# Patient Record
Sex: Female | Born: 1955 | Race: Black or African American | Hispanic: No | Marital: Single | Smoking: Current every day smoker
Health system: Southern US, Community
[De-identification: ages and names within clinical notes are randomized; demographics above are authoritative.]

## PROBLEM LIST (undated history)

## (undated) DIAGNOSIS — I10 Essential (primary) hypertension: Secondary | ICD-10-CM

## (undated) DIAGNOSIS — D649 Anemia, unspecified: Secondary | ICD-10-CM

## (undated) DIAGNOSIS — G629 Polyneuropathy, unspecified: Secondary | ICD-10-CM

## (undated) DIAGNOSIS — F329 Major depressive disorder, single episode, unspecified: Secondary | ICD-10-CM

## (undated) DIAGNOSIS — C50919 Malignant neoplasm of unspecified site of unspecified female breast: Secondary | ICD-10-CM

## (undated) DIAGNOSIS — Z9221 Personal history of antineoplastic chemotherapy: Secondary | ICD-10-CM

## (undated) DIAGNOSIS — E7800 Pure hypercholesterolemia, unspecified: Secondary | ICD-10-CM

## (undated) DIAGNOSIS — F32A Depression, unspecified: Secondary | ICD-10-CM

## (undated) HISTORY — DX: Depression, unspecified: F32.A

## (undated) HISTORY — DX: Malignant neoplasm of unspecified site of unspecified female breast: C50.919

## (undated) HISTORY — PX: PORTACATH PLACEMENT: SHX2246

## (undated) HISTORY — DX: Major depressive disorder, single episode, unspecified: F32.9

## (undated) HISTORY — DX: Essential (primary) hypertension: I10

## (undated) HISTORY — DX: Pure hypercholesterolemia, unspecified: E78.00

---

## 1979-05-22 HISTORY — PX: ABDOMINAL HYSTERECTOMY: SHX81

## 2013-05-21 HISTORY — PX: BREAST BIOPSY: SHX20

## 2013-09-18 DIAGNOSIS — C50919 Malignant neoplasm of unspecified site of unspecified female breast: Secondary | ICD-10-CM

## 2013-09-18 HISTORY — DX: Malignant neoplasm of unspecified site of unspecified female breast: C50.919

## 2014-06-21 ENCOUNTER — Ambulatory Visit: Payer: Self-pay | Admitting: Internal Medicine

## 2014-06-29 ENCOUNTER — Inpatient Hospital Stay: Payer: Self-pay | Admitting: Psychiatry

## 2014-07-02 ENCOUNTER — Ambulatory Visit: Payer: Self-pay | Admitting: Internal Medicine

## 2014-07-15 ENCOUNTER — Encounter: Payer: Self-pay | Admitting: General Surgery

## 2014-07-15 ENCOUNTER — Ambulatory Visit (INDEPENDENT_AMBULATORY_CARE_PROVIDER_SITE_OTHER): Payer: BLUE CROSS/BLUE SHIELD | Admitting: General Surgery

## 2014-07-15 ENCOUNTER — Other Ambulatory Visit: Payer: Self-pay | Admitting: General Surgery

## 2014-07-15 VITALS — BP 100/60 | HR 74 | Resp 12 | Ht 62.0 in | Wt 138.0 lb

## 2014-07-15 DIAGNOSIS — C50911 Malignant neoplasm of unspecified site of right female breast: Secondary | ICD-10-CM

## 2014-07-15 DIAGNOSIS — F32A Depression, unspecified: Secondary | ICD-10-CM

## 2014-07-15 DIAGNOSIS — F329 Major depressive disorder, single episode, unspecified: Secondary | ICD-10-CM | POA: Diagnosis not present

## 2014-07-15 DIAGNOSIS — C50919 Malignant neoplasm of unspecified site of unspecified female breast: Secondary | ICD-10-CM | POA: Insufficient documentation

## 2014-07-15 NOTE — Progress Notes (Signed)
Patient ID: Gina Deleon, female   DOB: 07-Apr-1956, 59 y.o.   MRN: [id number]  Chief Complaint  Patient presents with  . Other    port placement    HPI Gina Deleon is a 59 y.o. female here today for a evaluation of a an port placement. She was in [place] with one of her aughter when the breast cancer was diagnosed. ( She finally told her family about the mass.)  She is now living here with her oldest daughter, Gina Deleon, who is present, her son lives here in [place] as well.  The lump in the breast had been there for 3 years prior to having a breast biopsy.  HPI  Past Medical History  Diagnosis Date  . Depression   . Cancer May 2015    right breast    Past Surgical History  Procedure Laterality Date  . Abdominal hysterectomy  1981    Family History  Problem Relation Age of Onset  . Hypertension Mother   . Diabetes Mother     Social History History  Substance Use Topics  . Smoking status: Current Every Day Smoker -- 1.00 packs/day for 20 years    Types: Cigarettes  . Smokeless tobacco: Not on file  . Alcohol Use: 0.0 oz/week    0 Standard drinks or equivalent per week    No Known Allergies  Current Outpatient Prescriptions  Medication Sig Dispense Refill  . FLUoxetine (PROZAC) 20 MG tablet Take 20 mg by mouth daily.    . traZODone (DESYREL) 100 MG tablet Take 100 mg by mouth as needed for sleep.     No current facility-administered medications for this visit.    Review of Systems Review of Systems  Constitutional: Negative.   Respiratory: Negative.   Cardiovascular: Negative.     Blood pressure 100/60, pulse 74, resp. rate 12, height 5' 2"  (1.575 m), weight 138 lb (62.596 kg).  Physical Exam Physical Exam  Constitutional: She is oriented to person, place, and time. She appears well-developed and well-nourished.  Neck: Neck supple.  Cardiovascular: Normal rate, regular rhythm and normal heart sounds.   Pulmonary/Chest: Effort normal and breath  sounds normal. Right breast exhibits mass. Right breast exhibits no inverted nipple, no nipple discharge and no skin change. Left breast exhibits no inverted nipple, no mass, no nipple discharge, no skin change and no tenderness.    Lymphadenopathy:    She has no cervical adenopathy.       Right cervical: No superficial cervical adenopathy present.      Left cervical: No superficial cervical adenopathy present.    She has no axillary adenopathy.  Neurological: She is alert and oriented to person, place, and time.  Skin: Skin is warm and dry.    Data Reviewed Review of medical oncology notes dated 07/08/2014 reports invasive ductal carcinoma, ER positive, PR negative, HER-2 negative, grade 3, Oncotype risk recurrence score of 46.  Laboratory studies dated 06/28/2014: CEA: 2.6. CA 27-29: 23.2. Potassium 2.9. Hemoglobin 15.2 with an MCV of 96, white blood cell count 8600.  Laboratory studies dated 07/01/2004 showed a normal electrolytes with mild elevation of chloride at 109. Glucose 109.  PET/CT dated 07/01/2014 showed a hypermetabolic mass in the right breast. Single right axillary lymph node with SUV of 1.9. No other distant metastatic disease. Possible small gallstones.  Head CT dated 06/28/2014 was negative.  Right shoulder MRI shows complete supraspinatus tendon tear. (07/02/2014  Assessment    Stage 3 carcinoma the right breast. Plans for  neoadjuvant chemotherapy.     Plan    Left side PowerPort placement is planned.    The risks associated with central venous access including arterial, pulmonary and venous injury were reviewed. The possible need for additional treatment if pulmonary injury occurs (chest tube placement) was discussed.  Patient's surgery has been scheduled for 07-22-14 at [facility].   PCP:  No Pcp Per Patient Ref: Dr. Raynelle Bring, [place] Gleason 07/15/2014, 1:56 PM

## 2014-07-15 NOTE — Patient Instructions (Addendum)

## 2014-07-20 ENCOUNTER — Ambulatory Visit
Admit: 2014-07-20 | Disposition: A | Discharge status: Home / Self Care | Payer: Self-pay | Attending: Internal Medicine | Admitting: Internal Medicine

## 2014-07-22 ENCOUNTER — Ambulatory Visit: Payer: Self-pay | Admitting: General Surgery

## 2014-07-22 ENCOUNTER — Encounter: Payer: Self-pay | Admitting: General Surgery

## 2014-07-22 DIAGNOSIS — C50911 Malignant neoplasm of unspecified site of right female breast: Secondary | ICD-10-CM | POA: Diagnosis not present

## 2014-07-23 ENCOUNTER — Encounter: Payer: Self-pay | Admitting: General Surgery

## 2014-08-10 ENCOUNTER — Emergency Department: Payer: Self-pay | Admitting: Emergency Medicine

## 2014-08-13 LAB — CBC CANCER CENTER
BASOS ABS: 0.1 x10 3/mm (ref 0.0–0.1)
Basophil %: 1.2 %
Eosinophil #: 0 x10 3/mm (ref 0.0–0.7)
Eosinophil %: 0.7 %
HCT: 36.1 % (ref 35.0–47.0)
HGB: 12.1 g/dL (ref 12.0–16.0)
LYMPHS ABS: 1.4 x10 3/mm (ref 1.0–3.6)
LYMPHS PCT: 31.3 %
MCH: 31.4 pg (ref 26.0–34.0)
MCHC: 33.4 g/dL (ref 32.0–36.0)
MCV: 94 fL (ref 80–100)
MONO ABS: 0.3 x10 3/mm (ref 0.2–0.9)
MONOS PCT: 7.2 %
NEUTROS ABS: 2.6 x10 3/mm (ref 1.4–6.5)
NEUTROS PCT: 59.6 %
Platelet: 113 x10 3/mm — ABNORMAL LOW (ref 150–440)
RBC: 3.84 10*6/uL (ref 3.80–5.20)
RDW: 13.4 % (ref 11.5–14.5)
WBC: 4.4 x10 3/mm (ref 3.6–11.0)

## 2014-08-18 LAB — CBC CANCER CENTER
Basophil #: 0 x10 3/mm (ref 0.0–0.1)
Basophil %: 0.8 %
EOS PCT: 0.5 %
Eosinophil #: 0 x10 3/mm (ref 0.0–0.7)
HCT: 34.6 % — ABNORMAL LOW (ref 35.0–47.0)
HGB: 11.7 g/dL — ABNORMAL LOW (ref 12.0–16.0)
LYMPHS ABS: 1.5 x10 3/mm (ref 1.0–3.6)
Lymphocyte %: 23.5 %
MCH: 31.7 pg (ref 26.0–34.0)
MCHC: 33.9 g/dL (ref 32.0–36.0)
MCV: 93 fL (ref 80–100)
MONO ABS: 0.8 x10 3/mm (ref 0.2–0.9)
Monocyte %: 12.2 %
NEUTROS ABS: 4 x10 3/mm (ref 1.4–6.5)
Neutrophil %: 63 %
PLATELETS: 311 x10 3/mm (ref 150–440)
RBC: 3.7 10*6/uL — ABNORMAL LOW (ref 3.80–5.20)
RDW: 13.8 % (ref 11.5–14.5)
WBC: 6.3 x10 3/mm (ref 3.6–11.0)

## 2014-08-18 LAB — POTASSIUM: Potassium: 4.4 mmol/L

## 2014-08-20 ENCOUNTER — Ambulatory Visit
Admit: 2014-08-20 | Disposition: A | Discharge status: Home / Self Care | Payer: Self-pay | Attending: Internal Medicine | Admitting: Internal Medicine

## 2014-08-23 LAB — CREATININE, SERUM: Creatine, Serum: 0.65

## 2014-08-25 LAB — CBC CANCER CENTER
Basophil #: 0.2 x10 3/mm — ABNORMAL HIGH (ref 0.0–0.1)
Basophil %: 3.3 %
Eosinophil #: 0.1 x10 3/mm (ref 0.0–0.7)
Eosinophil %: 0.8 %
HCT: 35.3 % (ref 35.0–47.0)
HGB: 11.9 g/dL — ABNORMAL LOW (ref 12.0–16.0)
Lymphocyte #: 1.9 x10 3/mm (ref 1.0–3.6)
Lymphocyte %: 28.8 %
MCH: 31.7 pg (ref 26.0–34.0)
MCHC: 33.7 g/dL (ref 32.0–36.0)
MCV: 94 fL (ref 80–100)
MONO ABS: 0.8 x10 3/mm (ref 0.2–0.9)
MONOS PCT: 12.6 %
NEUTROS ABS: 3.5 x10 3/mm (ref 1.4–6.5)
Neutrophil %: 54.5 %
Platelet: 522 x10 3/mm — ABNORMAL HIGH (ref 150–440)
RBC: 3.75 10*6/uL — ABNORMAL LOW (ref 3.80–5.20)
RDW: 14.6 % — AB (ref 11.5–14.5)
WBC: 6.4 x10 3/mm (ref 3.6–11.0)

## 2014-08-25 LAB — COMPREHENSIVE METABOLIC PANEL
ALT: 8 U/L — AB
Albumin: 3.6 g/dL
Alkaline Phosphatase: 72 U/L
Anion Gap: 6 — ABNORMAL LOW (ref 7–16)
BILIRUBIN TOTAL: 0.3 mg/dL
BUN: 5 mg/dL — AB
CHLORIDE: 109 mmol/L
CO2: 26 mmol/L
CREATININE: 0.63 mg/dL
Calcium, Total: 8.9 mg/dL
EGFR (African American): >60
GLUCOSE: 104 mg/dL — AB
Potassium: 3.7 mmol/L
SGOT(AST): 14 U/L — ABNORMAL LOW
SODIUM: 141 mmol/L
Total Protein: 6.9 g/dL

## 2014-09-01 LAB — CBC CANCER CENTER
BASOS ABS: 0.1 x10 3/mm (ref 0.0–0.1)
Basophil %: 1.8 %
Eosinophil #: 0.1 x10 3/mm (ref 0.0–0.7)
Eosinophil %: 2.6 %
HCT: 34.1 % — ABNORMAL LOW (ref 35.0–47.0)
HGB: 11.2 g/dL — ABNORMAL LOW (ref 12.0–16.0)
Lymphocyte #: 1.1 x10 3/mm (ref 1.0–3.6)
Lymphocyte %: 32.1 %
MCH: 31.2 pg (ref 26.0–34.0)
MCHC: 32.7 g/dL (ref 32.0–36.0)
MCV: 95 fL (ref 80–100)
Monocyte #: 0.2 x10 3/mm (ref 0.2–0.9)
Monocyte %: 4.9 %
Neutrophil #: 2.1 x10 3/mm (ref 1.4–6.5)
Neutrophil %: 58.6 %
PLATELETS: 175 x10 3/mm (ref 150–440)
RBC: 3.59 10*6/uL — AB (ref 3.80–5.20)
RDW: 15 % — AB (ref 11.5–14.5)
WBC: 3.5 x10 3/mm — ABNORMAL LOW (ref 3.6–11.0)

## 2014-09-08 LAB — CBC CANCER CENTER
Basophil #: 0.1 x10 3/mm (ref 0.0–0.1)
Basophil %: 1.1 %
Eosinophil #: 0.1 x10 3/mm (ref 0.0–0.7)
Eosinophil %: 0.7 %
HCT: 33.7 % — AB (ref 35.0–47.0)
HGB: 11.2 g/dL — AB (ref 12.0–16.0)
Lymphocyte #: 1.6 x10 3/mm (ref 1.0–3.6)
Lymphocyte %: 19.2 %
MCH: 32 pg (ref 26.0–34.0)
MCHC: 33.3 g/dL (ref 32.0–36.0)
MCV: 96 fL (ref 80–100)
MONO ABS: 0.8 x10 3/mm (ref 0.2–0.9)
Monocyte %: 9.5 %
NEUTROS PCT: 69.5 %
Neutrophil #: 6 x10 3/mm (ref 1.4–6.5)
PLATELETS: 165 x10 3/mm (ref 150–440)
RBC: 3.5 10*6/uL — ABNORMAL LOW (ref 3.80–5.20)
RDW: 15.4 % — ABNORMAL HIGH (ref 11.5–14.5)
WBC: 8.6 x10 3/mm (ref 3.6–11.0)

## 2014-09-13 LAB — SURGICAL PATHOLOGY

## 2014-09-15 ENCOUNTER — Other Ambulatory Visit: Payer: Self-pay | Admitting: Internal Medicine

## 2014-09-15 ENCOUNTER — Other Ambulatory Visit: Payer: Self-pay | Admitting: Family Medicine

## 2014-09-15 DIAGNOSIS — C50911 Malignant neoplasm of unspecified site of right female breast: Secondary | ICD-10-CM

## 2014-09-15 LAB — COMPREHENSIVE METABOLIC PANEL
ALT: 7 U/L — AB
Albumin: 3.7 g/dL
Alkaline Phosphatase: 79 U/L
Anion Gap: 3 — ABNORMAL LOW (ref 7–16)
BILIRUBIN TOTAL: 0.4 mg/dL
BUN: 7 mg/dL
Calcium, Total: 9.1 mg/dL
Chloride: 109 mmol/L
Co2: 27 mmol/L
Creatinine: 0.65 mg/dL
EGFR (African American): >60
EGFR (Non-African Amer.): >60
Glucose: 90 mg/dL
POTASSIUM: 3.9 mmol/L
SGOT(AST): 11 U/L — ABNORMAL LOW
SODIUM: 139 mmol/L
TOTAL PROTEIN: 6.9 g/dL

## 2014-09-15 LAB — CBC CANCER CENTER
Basophil #: 0.1 x10 3/mm (ref 0.0–0.1)
Basophil %: 1.2 %
Eosinophil #: 0.1 x10 3/mm (ref 0.0–0.7)
Eosinophil %: 1.1 %
HCT: 34.1 % — ABNORMAL LOW (ref 35.0–47.0)
HGB: 11.4 g/dL — ABNORMAL LOW (ref 12.0–16.0)
Lymphocyte #: 1.3 x10 3/mm (ref 1.0–3.6)
Lymphocyte %: 17.6 %
MCH: 32 pg (ref 26.0–34.0)
MCHC: 33.5 g/dL (ref 32.0–36.0)
MCV: 96 fL (ref 80–100)
MONO ABS: 1.2 x10 3/mm — AB (ref 0.2–0.9)
MONOS PCT: 17.1 %
Neutrophil #: 4.5 x10 3/mm (ref 1.4–6.5)
Neutrophil %: 63 %
PLATELETS: 378 x10 3/mm (ref 150–440)
RBC: 3.57 10*6/uL — ABNORMAL LOW (ref 3.80–5.20)
RDW: 17.5 % — AB (ref 11.5–14.5)
WBC: 7.1 x10 3/mm (ref 3.6–11.0)

## 2014-09-19 NOTE — Consult Note (Signed)
PATIENT NAME:  Gina Deleon, Gina Deleon MR#:  [id number] DATE OF BIRTH:  09/13/1955  DATE OF CONSULTATION:  06/30/2014  REFERRING PHYSICIAN:   CONSULTING PHYSICIAN:  Simonne Come. Inez Pilgrim, MD  HISTORY OF PRESENT ILLNESS: Ms. Hornak is a 59 year old patient, who was just admitted to [facility] with diagnosis of depression, alcohol abuse, an impulsive suicide attempt. Noted that the patient has denial regarding her alcohol use. This is the patient's first psychiatric hospitalization. She has had no signs of alcohol withdrawal. With respect to oncology consultation for breast cancer, the patient has significant history from the patient and also from some records from a hospital in [place] have been provided and her history is briefly as follows: In May of 2015, the patient presented to a hospital in [place] with a right breast mass, which she admitted she had ignored for as long as 3 years, she only presented to a doctor at the urging of her daughter. She was noted to have a breast mass, and she has a 3 to 4 cm breast mass, and possibly a palpable axillary lymph node. The patient had a core breast biopsy and fine needle aspiration of the axillary nodes. The axilla node was negative. The breast cancer was a positive diagnosis, an ER positive, PR negative, HER-2 negative disease with a high proliferative index, grade 3 cancer. She also had an Oncotype score done which was a high risk with a score of 46. She delayed her follow-up for personal issues, but in October returned to oncology. At that time she had had the recent gradual onset of weakness and some pain in the right upper arm and shoulder with effort-dependent pain in the deltoid and the triceps area. She did not experience any trauma. She had no other weakness or neurologic problems. She was recommended for staging studies, which were planned but then the patient withdrew from care, again for personal issues, she had some family and insurance matters, she has  moved to [place] then back to [place], but has now moved to [place] living with her daughter. She recently noted the mass is enlarging and she does now want to have it taken care of. She understands the significance of cancer and all of the potential treatments including surgery, chemotherapy and radiation that may be involved.   ADDITIONAL PAST MEDICAL HISTORY: Hypertension and the patient has had antihypertensive medications.   ALLERGIES: MORPHINE.   ADMISSION MEDICATIONS: Lisinopril, amlodipine.   SOCIAL HISTORY: Previously worked as a Quarry manager, retired in December 2014.   REVIEW OF SYSTEMS: When seen, and also at the time of admission, had no headache, dizziness, chills, or sweats. No fever, no weight loss or appetite change. No ear or jaw problems. Has not had thrush. No cough, wheezing, hemoptysis. No chest pain or palpitations. No abdominal pain, nausea, vomiting or diarrhea. No hot or cold intolerance. No edema. No bone pain. No rash or bruising. No numbness or tingling of the extremities. She does have weakness of the right arm. This was noticed as far back as September or early October. She reports that it came on somewhat gradually but reached a certain point and had never progressed. She has difficulty lifting her arm overhead, specifically this is on flexion and abduction of the shoulder. She can do it slowly to a certain extent, but not quickly. She does not have rest pain or tenderness in the shoulder. She does have pain on using the limb and there is a combination of weakness and effort dependent weakness due  to pain when she puts the arm through further motion. There is no neck pain. No numbness or tingling of the extremity.   PHYSICAL EXAMINATION: VITAL SIGNS: Unremarkable. I believe the blood pressures run low range.  EYES: Sclerae clear. MOUTH: No thrush.  NECK: Supple.  LYMPHATIC: No palpable lymph nodes in the neck, supraclavicular, submandibular, or axilla.  HEART:  Regular.  ABDOMEN: Firm, nontender. No palpable mass or organomegaly.  EXTREMITIES: Right arm: Cannot flex the shoulder past 80 to 90 degrees, also similarly limited abduction. There is slight weakness in the grip strength. See above on subjective exam, there is pain at rest. No numbness or tingling of the extremities.  BREASTS: There is a large, hard smooth breast mass in the upper inner quadrant of the right breast, approximately 7 cm in size. Overlying skin is not effected. There is a small approximately 6 to 8 mm palpable node in the tail of the breast, low in the axilla.   DIAGNOSTIC DATA: Labs on admission: Glucose is slightly elevated at 145. Blood alcohol was 230. Liver functions unremarkable, except borderline AST of 39 and alkaline phosphatase of 134. Troponin was low. CBC was normal. PT/INR was normal.   IMPRESSION: The patient with pathologic diagnosis, done in [place], where the breast mass is an ER positive, PR negative, HER-2 negative, poorly differentiated tumor. Clinically, the tumor is approximately 7 cm. This is confirmed on measurement or on reimaging this would be a T3 cancer. There is palpable axillary tail node, unknown if this is pathologically involved, but clinically it appears to be M1a, this looks like a stage III breast cancer. There is concern about pain in the shoulder and weakness. This could be a benign injury. This could be benign rotator cuff or possibly some radicular cervical issue, concerned about brachial plexus involvement although there is no pain, potentially this could be central issue but if this were a stroke or tumor then there would be no progression since November, no other neurologic problems and a noncontrast head CT was negative for any obvious areas of tumor or infarct.   PLAN: Pending the evaluation and disposition of psychiatry, when the patient is stable for discharge, she will be followed in the [facility], also have radiation/oncology  consultation and surgical consultation. Get a PET scan for staging. If we rule out metastatic disease, she is a good candidate for getting an echocardiogram and placing a Port-A-Cath and giving neoadjuvant chemotherapy, followed by surgery. Radiation treatment depends on the clinical progression, the size of the initial tumor, whether or not the patient wants or could even have breast conservation, re-evaluation, then the next step is after the PET scan. All the oncology follow-up can be done as an outpatient. The patient understands, tumor, the worries of progression, potential to cure with treatment of a localized cancer, the fact that metastatic cancer would be incurable but still highly treatable, and the patient is completely committed to following up with her cancer treatments here at [facility]. ____________________________ Simonne Come. Inez Pilgrim, MD rgg:sb D: 07/02/2014 08:13:10 ET T: 07/02/2014 11:09:17 ET JOB#: [id number]  cc: Simonne Come. Inez Pilgrim, MD, <Dictator> Dallas Schimke MD ELECTRONICALLY SIGNED 07/27/2014 21:48

## 2014-09-19 NOTE — Consult Note (Signed)
PATIENT NAME:  Gina Deleon, Gina Deleon MR#:  [id number] DATE OF BIRTH:  Sep 04, 1955  DATE OF CONSULTATION:  06/29/2014  REFERRING PHYSICIAN:   CONSULTING PHYSICIAN:  Gonzella Lex, MD  IDENTIFYING INFORMATION AND REASON FOR CONSULTATION: A 59 year old woman with a history of no prior psych treatment, came into the hospital unresponsive after taking an overdose.   CHIEF COMPLAINT: "I took pills yesterday."   HISTORY OF PRESENT ILLNESS: The patient admits that last night while at home she took an overdose of her blood pressure medicine taking both lisinopril and amlodipine. She thinks she took some antibiotics too. She said this was done fairly much on the spur of the moment. She has been feeling depressed and angry since Sunday. Specifically, it sounds like she is angry at her son because she thinks he is trying to throw her out of the house. She says that she normally sleeps fine and eats fine. Denies feeling depressed regularly. Says she had not been planning a suicide attempt. She admits that she drinks and especially the last couple of days has been drinking, probably Sunday and Monday. She does not really make a connection between that and her behavior. Denies any psychotic symptoms.   PAST PSYCHIATRIC HISTORY: No past psychiatric history. No hospitalization. No suicide attempts. No prescription of medication. She says that no one else has ever told her her drinking was a problem. She does not think her drinking is a problem.   PAST MEDICAL HISTORY: She says she has been told she had breast cancer, but she has no insurance or Medicaid or Medicare coverage to get the surgery. The diagnosis was made in [place] a few months ago. She denies any other ongoing medical problems.   SOCIAL HISTORY: Recently relocated from [place] up here to [facility] to live with her son back in November. Not working. Living with her son and daughter-in-law. Has several other children scattered around the [place], another  daughter who lives here locally. Does not have any insurance coverage. 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   SUBSTANCE ABUSE HISTORY: Denies that drinking is a problem. No history of withdrawal problems and denies other drugs.   FAMILY HISTORY: No known family history of mental health problems.   REVIEW OF SYSTEMS: Says that she is feeling tired. Denies hallucinations. Denies current suicidal ideation. No other specific physical symptoms. Specifically, she denies feeling dizzy or lightheaded when she stands up from a lying down position.   MENTAL STATUS EXAMINATION: Slightly disheveled woman, looks her stated age, passively cooperative, intermittent eye contact psychomotor activity slow. Speech slow, quiet, and decreased in amount. Affect flat. Mood stated as okay. Thoughts slow, thought blocking or possibly evasiveness. No obvious delusions. Denies auditory or visual hallucinations. Denies suicidal or homicidal ideation. She is alert and oriented x4. Can repeat 3 words immediately and at three minutes. Judgment and insight impaired. Intelligence probably normal.   LABORATORY RESULTS: Alcohol level on presentation 230. Troponins negative. Chemistry showed elevated alkaline phosphatase, elevated AST, low albumin at 3.2. Initially low potassium at 2.9. Elevated glucose 169. Drug screen negative. Alcohol level today negative. CBC all within normal limits. Urinalysis 1+ leukocyte esterase, positive white cells, probable infection.   VITAL SIGNS: Currently, blood pressure 101/96. She had a spell of low blood pressure this morning and was given fluid. Even during that time I was able to stand her up without  any clinical signs of orthostasis. Pulse 94, respirations 18.   ASSESSMENT: A 59 year old woman who presents with a suicide attempt, depressed affect. Minimizes her drinking, but clearly was drinking more than just a little  when she came in. Alcohol probably involved in her behavior. Currently withdrawn, moody, not forthcoming. Needs hospitalization to stabilize.   TREATMENT PLAN: Admit to psychiatry. Detox orders can be in place. Psychoeducation. Treat urinary tract infection. Primary treatment team can re-evaluate. I am going to recheck her electrolytes as well.   DIAGNOSIS, PRINCIPAL AND PRIMARY:  AXIS I: Major depression, single, severe.   SECONDARY DIAGNOSES: AXIS I: Alcohol abuse, moderate.  AXIS II: Deferred.  AXIS III: Breast cancer by her report and high blood pressure.  ____________________________ Gonzella Lex, MD jtc:sb D: 06/29/2014 15:31:31 ET T: 06/29/2014 15:46:19 ET JOB#: [id number]  cc: Gonzella Lex, MD, <Dictator> Gonzella Lex MD ELECTRONICALLY SIGNED 06/30/2014 17:29

## 2014-09-19 NOTE — Consult Note (Signed)
Chief Complaint:  Subjective/Chief Complaint SEE IMP AND PLAN   VITAL SIGNS/ANCILLARY NOTES: **Vital Signs.:   12-Feb-16 06:46  Vital Signs Type Routine  Temperature Temperature (F) 98.8  Celsius 37.1  Pulse Pulse 99  Respirations Respirations 20  Systolic BP Systolic BP 87  Diastolic BP (mmHg) Diastolic BP (mmHg) 61  Pulse Pulse Sitting 99  Systolic BP Systolic BP 81  Diastolic BP (mmHg) Diastolic BP (mmHg) 56  Pulse Standing Pulse Standing 108   Assessment/Plan:  Assessment/Plan:  Assessment NOT EXAMINED TODAY,  WILL F/U LATER....DISCUSSED WITH NURSING.  DISCUSSED WITH ORTHOPEDICS   Plan MRI NON CONTRAST SHOULDER FOR POSSIBLE ROTATOR CUFF INJURY..IF DISCHARGED HOME TODAY BEFOFRE SCAN , WOULD BE OK TO GO HOME AND RESCHEDULE AS OUTPATIENT . IF DISCHARGED TODAY, WILL NEED APPT TO SEE DR Inez Pilgrim AS OUT PATIENT IN AFTERNOON WEDS 2/17   Electronic Signatures: Dallas Schimke (MD)  (Signed 12-Feb-16 09:25)  Authored: Chief Complaint, VITAL SIGNS/ANCILLARY NOTES, Assessment/Plan   Last Updated: 12-Feb-16 09:25 by Dallas Schimke (MD)

## 2014-09-19 NOTE — Op Note (Signed)
PATIENT NAME:  Gina Deleon, HABLE MR#:  [id number] DATE OF BIRTH:  Dec 19, 1955  DATE OF PROCEDURE:  07/22/2014  PREOPERATIVE DIAGNOSIS: Advanced right breast cancer, need for central venous access.   POSTOPERATIVE DIAGNOSIS:  Advanced right breast cancer, need for central venous access.   OPERATIVE PROCEDURE: Left subclavian PowerPort placement with ultrasound and fluoroscopic guidance.   SURGEON: Robert Bellow, MD   ANESTHESIA: Attended local, 10 mL of 1% plain Xylocaine.   CLINICAL NOTE: This 59 year old woman was recently diagnosed with a stage III carcinoma of the right breast. Neoadjuvant chemotherapy is planned. Central venous access was requested by her treating oncologist. The patient received Kefzol prior to the procedure.   OPERATIVE NOTE: With the patient under adequate sedation, the left chest was prepped with ChloraPrep and draped. Ultrasound was used to confirm patency of the left subclavian vein. This was cannulated under ultrasound guidance on a single stick. The guidewire was advanced.   Under fluoroscopy, it was found that the guidewire was coiled near the junction of the brachiocephalic vein. This was withdrawn and readvanced several times. The catheter eventually was then directed into the SVC by replacing the guidewire and manipulating it inferiorly. It was positioned at the junction of the SVC and right atrium. It was tunneled to a pocket on the left anterior chest. It was necessary to extend the initial insertion site incision to uncoil and un-crimp the catheter so that it would easily irrigate and aspirate. The catheter was attached to the port and the port attached to the deep tissue with interrupted 3-0 Prolene sutures.   The wound was closed in layers with 3-0 Vicryl to the adipose tissue and a running 4-0 Vicryl subcuticular suture to both sites. The catheter was again irrigated and aspirated with injectable saline and the procedure terminated. Benzoin and Steri-Strips  followed by Telfa and Tegaderm dressings were applied.   Erect portable chest x-ray in the recovery room showed the catheter tip as described above and no evidence of pneumothorax.    ____________________________ Robert Bellow, MD jwb:ah D: 07/22/2014 22:10:05 ET T: 07/23/2014 09:20:20 ET JOB#: [id number]  cc: Robert Bellow, MD, <Dictator> Simonne Come. Inez Pilgrim, MD Shanena Pellegrino Amedeo Kinsman MD ELECTRONICALLY SIGNED 07/24/2014 7:41

## 2014-09-19 NOTE — Consult Note (Signed)
Brief Consult Note: Diagnosis: DEPRESSION, ETOH ABUSE, SUICIDE ATTEMPT BREAST CANCER.   Orders entered.   Comments: PATIENT SEEN , CHART, ADMIT NOTE, REVIEWED. MED RECORDS FROM [facility] REVIEWED. ON EXAM, 9 TO 10 CM MASS RIGHTBREAST, ALSO APPROX 6MM FIRM NODE TAIL OF BREAST  . HX OF DX 09/2013 AT WHICH TIME MASS WAS PRESENT FOR 3 YRS AS PER PATIENT. 10/2013 BREAST CORE BX POS FOR ER POS PR NEG HER 2 NEG , HIGH PROLIFERATIVE INDEX, GRADE 3 INFILTRATING DUCTAL CANCER. ALSO HIGH RISK ON ONCOTYPE, SCORE 46.,  AXILLARY NODE FNA NEGATIVE. PATIENT THEN DELAYED F/U. IN OCT/2015 RETURNED TO ONCOLOGY F/U, WITH NEW, RECENT, GRADUAL ONSET WEAKNESS AND SOME PAIN RIGHT UPPER ARM AND SHULDER, PAIN EFFORT DEPENDANT, WITHOUT TRAUMA.  STAGING PLANNED BUT PATIENT DISAPPEARED FROM F/U, STATING FAMILY ISSUES, INSURANCE, AND MOVING. PATIENT AWARE THAT MASS IS ENLARGING..SHE WANTS TO HAVE TAKERN CARE OF, AND IS AWARE OF SIGNIFICANCE OF CANCER, AND UNDERSTANDS SURGERY, [place], RADIATION TX....Marland KitchenSUMMARY AND PLAN.Manfred Shirts STUDY WITH PET/CT. DEPENDING ON FINDINGS POSSIBLE BRAIN MRI. DOCUMENT TUMOR MARKERS. IF STAGE 4 THEN INITIAL TX HORMONE VS CHEMOTX. WILL WANT ECHO AND PORT PRIOR TO CHEMOTX. IF NON METASTATIC THEN PLANNING NEOADJUVANT CHEMOTX, WILL CONSULT SURGERY AND RADIATION ONCOLOGY....WORKUP COULD ALL BE DONE AS OUT PATIENT IF STABLE FOR DISCHARGE. I WILL ORDER TUMOR MARKERS AND PET/CT.  Electronic Signatures: Dallas Schimke (MD)  (Signed 10-Feb-16 21:10)  Authored: Brief Consult Note   Last Updated: 10-Feb-16 21:10 by Dallas Schimke (MD)

## 2014-09-19 NOTE — H&P (Signed)
PATIENT NAME:  Gina Deleon, FINDER MR#:  [id number] DATE OF BIRTH:  [birth year]  DATE OF ADMISSION:  06/29/2014  REFERRING PHYSICIAN: Emergency Room MD.   ATTENDING PHYSICIAN: Nyasia Baxley B. Bary Leriche, MD.   IDENTIFYING DATA: Miss Sword is a 59 year old female with no past psychiatric history.   CHIEF COMPLAINT: "I did something stupid."   HISTORY OF PRESENT ILLNESS: Miss Procter denies any prior history of depression or anxiety.  Per her chart it seems that the patient has a drinking problem, but she adamantly denies it. She reports drinking infrequently. On Sunday during the Super Bowl she denies being drunk, but her behavior was out of control and her son with whom she lives told her that she is embarrassing him in front of his guests. She took off and went to her room and took a handful of blood pressure medications, possibly some antibiotics. Her daughter who came to visit and found her in the room unresponsive and the patient was brought to the hospital. She adamantly denies symptoms of depression, anxiety, or psychosis. She does admit that as she was taking an overdose she briefly impulsively wanted to die, but is glad to be alive and has no suicidal thoughts, intentions, or plans. She denies symptoms suggestive of bipolar mania. She denies other than alcohol substance use, but also denies having any problems with alcohol. When confronted with the fact that at the time of admission to the hospital her blood alcohol level was over 200 she admitted to drinking 6 beers that day.   PAST PSYCHIATRIC HISTORY: She has never been hospitalized, never seen a psychiatrist, never attempted suicide, no substance abuse treatment.   FAMILY PSYCHIATRIC HISTORY: None reported.   PAST MEDICAL HISTORY: Hypertension.   ALLERGIES: MORPHINE.   MEDICATIONS ON ADMISSION: Lisinopril 20 mg daily, amlodipine 5 mg daily.  SOCIAL HISTORY: She used to work as a [employer]arry manager, retired in December of 2014. In May of 2015 while living in  [place] she was diagnosed with breast cancer, she was told that she will need chemotherapy, surgery, and radiation. She applied for Medicaid in [place]. In the meantime her daughter with whom she lived was deployed to [place] and the patient moved in with her son in [place]. She applied for Medicaid in [place], but also for disability and is still awaiting the response. She has not seen a doctor for her high blood pressure or breast cancer since moving to [place] in November of 2015. She currently lives with her son and his girlfriend. Her other daughter who is medically retired from [employer] after having a stroke lives in the area as well and is a major source of support.   REVIEW OF SYSTEMS:    CONSTITUTIONAL: No fevers or chills. No weight changes.  EYES: No double or blurred vision.  EARS, NOSE, AND THROAT: No hearing loss.  RESPIRATORY: No shortness of breath or cough.  CARDIOVASCULAR: No chest pain or orthopnea.  GASTROINTESTINAL: No abdominal pain, nausea, vomiting, or diarrhea.  GENITOURINARY: No incontinence or frequency.  ENDOCRINE: No heat or cold intolerance.  LYMPHATIC: No anemia or easy bruising.  INTEGUMENTARY: No acne or rash.  MUSCULOSKELETAL: No muscle or joint pain.  NEUROLOGIC: No tingling or weakness.  PSYCHIATRIC: See history of present illness for details.   PHYSICAL EXAMINATION:  VITAL SIGNS: Blood pressure 117/81, pulse 108, respirations 18, temperature 98.3.  GENERAL: This is a slender female looking older than stated age, in no acute distress.  HEENT: The pupils are equal, round,  and reactive to light. Sclerae are anicteric.  NECK: Supple. No thyromegaly.  LUNGS: Clear to auscultation. No dullness to percussion.  HEART: Regular rhythm and rate. No murmurs, rubs, or gallops.  ABDOMEN: Soft, nontender, nondistended. Positive bowel sounds.  MUSCULOSKELETAL: Normal muscle strength in all extremities.  SKIN: No rashes or bruises.  LYMPHATIC:  No cervical adenopathy.  NEUROLOGIC: Cranial nerves II through XII are intact.   LABORATORY DATA: Chemistries are within normal limits except for blood glucose of 145. Blood alcohol level initially 230. LFTs within normal limits except for alkaline phosphatase of 134 and AST of 39. Troponin less than 0.02. Urine toxicology screen is negative for substances. CBC is within normal limits. Prothrombin 12.7, INR 0.9. Urinalysis, leukocyte esterase 1 + with 12 white cells per field. EKG, sinus tachycardia, otherwise normal EKG.   MENTAL STATUS EXAMINATION ON ADMISSION: The patient is alert and oriented to person, place, time, and situation. She is pleasant, polite, and cooperative. She is well groomed, wearing hospital scrubs. She maintains good eye contact. Her speech is of normal rhythm, rate, and volume. Mood is depressed with anxious affect. Thought process is logical and goal oriented. She denies thoughts of hurting herself or others, but was admitted after a suicide attempt by overdose on multiple medications. There are no delusions or paranoia. There are no auditory or visual hallucinations. Her cognition is grossly intact. Registration, recall, short, and long-term memory are intact. She is of average intelligence and fund of knowledge. Her insight and judgment are limited.   SUICIDE RISK ASSESSMENT ON ADMISSION: This is a patient with no past psychiatric history, but most likely a drinking problem, who was admitted to the hospital after a suicide attempt by overdose on multiple medications in the context of family conflict.   INITIAL DIAGNOSES:   AXIS I:  1.  Major depressive episode, severe.  2.  Alcohol use disorder, severe.   AXIS II: Deferred.   AXIS III:  1.  Untreated breast cancer.  2.  Hypertension.   PLAN: The patient was admitted to [place] unit for safety, stabilization and medication management.   1.  Suicidal ideation. The patient is  able to contract for safety.  2.  Mood. We will start Prozac for depression.  3.  Alcohol detoxification. She is on the CIWA protocol, will monitor for symptoms of withdrawal.  4.  Hypertension. We will continue lisinopril and amlodipine.  5.  Substance abuse treatment. The patient  is not interested in residential treatment at this point.  6.  Breast cancer.  We will ask oncology to consult.  7.  UTI. She was started on Cipro in the ER. 8.  Disposition.  She will be discharged to home with her family. She will follow up with local provider, most likely [place].    ____________________________ Wardell Honour Bary Leriche, MD jbp:bu D: 06/29/2014 17:24:53 ET T: 06/29/2014 18:08:59 ET JOB#: [id number]  cc: Sydne Krahl B. Bary Leriche, MD, <Dictator> Clovis Fredrickson MD ELECTRONICALLY SIGNED 06/29/2014 19:10

## 2014-09-22 ENCOUNTER — Inpatient Hospital Stay: Payer: BLUE CROSS/BLUE SHIELD | Attending: Internal Medicine

## 2014-09-22 DIAGNOSIS — Z79899 Other long term (current) drug therapy: Secondary | ICD-10-CM | POA: Diagnosis not present

## 2014-09-22 DIAGNOSIS — Z418 Encounter for other procedures for purposes other than remedying health state: Secondary | ICD-10-CM | POA: Insufficient documentation

## 2014-09-22 DIAGNOSIS — H578 Other specified disorders of eye and adnexa: Secondary | ICD-10-CM | POA: Insufficient documentation

## 2014-09-22 DIAGNOSIS — Z170 Estrogen receptor positive status [ER+]: Secondary | ICD-10-CM | POA: Diagnosis not present

## 2014-09-22 DIAGNOSIS — C50919 Malignant neoplasm of unspecified site of unspecified female breast: Secondary | ICD-10-CM

## 2014-09-22 DIAGNOSIS — E780 Pure hypercholesterolemia: Secondary | ICD-10-CM | POA: Diagnosis not present

## 2014-09-22 DIAGNOSIS — F329 Major depressive disorder, single episode, unspecified: Secondary | ICD-10-CM | POA: Insufficient documentation

## 2014-09-22 DIAGNOSIS — Z90710 Acquired absence of both cervix and uterus: Secondary | ICD-10-CM | POA: Diagnosis not present

## 2014-09-22 DIAGNOSIS — I10 Essential (primary) hypertension: Secondary | ICD-10-CM | POA: Insufficient documentation

## 2014-09-22 DIAGNOSIS — Z5111 Encounter for antineoplastic chemotherapy: Secondary | ICD-10-CM | POA: Diagnosis not present

## 2014-09-22 DIAGNOSIS — C50911 Malignant neoplasm of unspecified site of right female breast: Secondary | ICD-10-CM | POA: Insufficient documentation

## 2014-09-22 DIAGNOSIS — M545 Low back pain: Secondary | ICD-10-CM | POA: Insufficient documentation

## 2014-09-22 LAB — CBC
HCT: 34.1 % — ABNORMAL LOW (ref 35.0–47.0)
Hemoglobin: 11.3 g/dL — ABNORMAL LOW (ref 12.0–16.0)
MCH: 31.9 pg (ref 26.0–34.0)
MCHC: 33.1 g/dL (ref 32.0–36.0)
MCV: 96.5 fL (ref 80.0–100.0)
PLATELETS: 251 10*3/uL (ref 150–440)
RBC: 3.54 MIL/uL — AB (ref 3.80–5.20)
RDW: 17.5 % — AB (ref 11.5–14.5)
WBC: 18.4 10*3/uL — AB (ref 3.6–11.0)

## 2014-09-22 LAB — POTASSIUM: POTASSIUM: 3.4 mmol/L — AB (ref 3.5–5.1)

## 2014-09-29 ENCOUNTER — Other Ambulatory Visit: Payer: Self-pay | Admitting: *Deleted

## 2014-09-29 ENCOUNTER — Inpatient Hospital Stay: Payer: BLUE CROSS/BLUE SHIELD

## 2014-09-29 DIAGNOSIS — C50919 Malignant neoplasm of unspecified site of unspecified female breast: Secondary | ICD-10-CM

## 2014-09-29 DIAGNOSIS — Z5111 Encounter for antineoplastic chemotherapy: Secondary | ICD-10-CM | POA: Diagnosis not present

## 2014-09-29 LAB — CBC WITH DIFFERENTIAL/PLATELET
BASOS PCT: 2 %
Basophils Absolute: 0.1 10*3/uL (ref 0–0.1)
Eosinophils Absolute: 0.1 10*3/uL (ref 0–0.7)
Eosinophils Relative: 2 %
HCT: 36.3 % (ref 35.0–47.0)
HEMOGLOBIN: 12 g/dL (ref 12.0–16.0)
Lymphocytes Relative: 24 %
Lymphs Abs: 1.4 10*3/uL (ref 1.0–3.6)
MCH: 32.1 pg (ref 26.0–34.0)
MCHC: 32.9 g/dL (ref 32.0–36.0)
MCV: 97.4 fL (ref 80.0–100.0)
Monocytes Absolute: 0.5 10*3/uL (ref 0.2–0.9)
Monocytes Relative: 9 %
Neutro Abs: 3.6 10*3/uL (ref 1.4–6.5)
Neutrophils Relative %: 63 %
Platelets: 223 10*3/uL (ref 150–440)
RBC: 3.73 MIL/uL — AB (ref 3.80–5.20)
RDW: 18.7 % — ABNORMAL HIGH (ref 11.5–14.5)
WBC: 5.7 10*3/uL (ref 3.6–11.0)

## 2014-09-29 LAB — POTASSIUM: POTASSIUM: 3.9 mmol/L (ref 3.5–5.1)

## 2014-10-04 ENCOUNTER — Telehealth: Payer: Self-pay | Admitting: *Deleted

## 2014-10-04 ENCOUNTER — Other Ambulatory Visit: Payer: Self-pay | Admitting: *Deleted

## 2014-10-04 DIAGNOSIS — C50919 Malignant neoplasm of unspecified site of unspecified female breast: Secondary | ICD-10-CM

## 2014-10-04 MED ORDER — LIDOCAINE-PRILOCAINE 2.5-2.5 % EX CREA: 1.0000 "application " | TOPICAL_CREAM | CUTANEOUS | Status: DC | PRN

## 2014-10-04 NOTE — Telephone Encounter (Signed)
V.O. -Dr. Inez Pilgrim- Ok to call in EMLA cream to pt's pharmacy... Done. Pt notified.Marland KitchenMarland Kitchen

## 2014-10-06 ENCOUNTER — Inpatient Hospital Stay (HOSPITAL_BASED_OUTPATIENT_CLINIC_OR_DEPARTMENT_OTHER): Payer: BLUE CROSS/BLUE SHIELD | Admitting: Internal Medicine

## 2014-10-06 ENCOUNTER — Encounter (INDEPENDENT_AMBULATORY_CARE_PROVIDER_SITE_OTHER): Payer: Self-pay

## 2014-10-06 ENCOUNTER — Inpatient Hospital Stay: Payer: BLUE CROSS/BLUE SHIELD

## 2014-10-06 ENCOUNTER — Telehealth: Payer: Self-pay

## 2014-10-06 VITALS — BP 152/98 | HR 78 | Temp 98.3°F | Resp 16 | Wt 145.5 lb

## 2014-10-06 DIAGNOSIS — F1010 Alcohol abuse, uncomplicated: Secondary | ICD-10-CM

## 2014-10-06 DIAGNOSIS — Z5111 Encounter for antineoplastic chemotherapy: Secondary | ICD-10-CM | POA: Diagnosis not present

## 2014-10-06 DIAGNOSIS — H578 Other specified disorders of eye and adnexa: Secondary | ICD-10-CM

## 2014-10-06 DIAGNOSIS — E780 Pure hypercholesterolemia: Secondary | ICD-10-CM

## 2014-10-06 DIAGNOSIS — Z79899 Other long term (current) drug therapy: Secondary | ICD-10-CM

## 2014-10-06 DIAGNOSIS — I10 Essential (primary) hypertension: Secondary | ICD-10-CM

## 2014-10-06 DIAGNOSIS — Z170 Estrogen receptor positive status [ER+]: Secondary | ICD-10-CM | POA: Diagnosis not present

## 2014-10-06 DIAGNOSIS — C50911 Malignant neoplasm of unspecified site of right female breast: Secondary | ICD-10-CM

## 2014-10-06 DIAGNOSIS — C50919 Malignant neoplasm of unspecified site of unspecified female breast: Secondary | ICD-10-CM

## 2014-10-06 DIAGNOSIS — Z418 Encounter for other procedures for purposes other than remedying health state: Secondary | ICD-10-CM | POA: Diagnosis not present

## 2014-10-06 DIAGNOSIS — F329 Major depressive disorder, single episode, unspecified: Secondary | ICD-10-CM

## 2014-10-06 LAB — CBC WITH DIFFERENTIAL/PLATELET
Basophils Absolute: 0.1 10*3/uL (ref 0–0.1)
Basophils Relative: 1 %
Eosinophils Absolute: 0.2 10*3/uL (ref 0–0.7)
Eosinophils Relative: 4 %
HCT: 37.7 % (ref 35.0–47.0)
Hemoglobin: 12.4 g/dL (ref 12.0–16.0)
LYMPHS ABS: 1.3 10*3/uL (ref 1.0–3.6)
Lymphocytes Relative: 20 %
MCH: 32.4 pg (ref 26.0–34.0)
MCHC: 33 g/dL (ref 32.0–36.0)
MCV: 98.1 fL (ref 80.0–100.0)
MONOS PCT: 11 %
Monocytes Absolute: 0.7 10*3/uL (ref 0.2–0.9)
NEUTROS ABS: 4.3 10*3/uL (ref 1.4–6.5)
Neutrophils Relative %: 64 %
Platelets: 271 10*3/uL (ref 150–440)
RBC: 3.85 MIL/uL (ref 3.80–5.20)
RDW: 18.1 % — ABNORMAL HIGH (ref 11.5–14.5)
WBC: 6.7 10*3/uL (ref 3.6–11.0)

## 2014-10-06 LAB — COMPREHENSIVE METABOLIC PANEL
ALK PHOS: 94 U/L (ref 38–126)
ALT: 9 U/L — ABNORMAL LOW (ref 14–54)
ANION GAP: 5 (ref 5–15)
AST: 14 U/L — ABNORMAL LOW (ref 15–41)
Albumin: 3.8 g/dL (ref 3.5–5.0)
BILIRUBIN TOTAL: 0.3 mg/dL (ref 0.3–1.2)
BUN: 8 mg/dL (ref 6–20)
CHLORIDE: 108 mmol/L (ref 101–111)
CO2: 27 mmol/L (ref 22–32)
Calcium: 9.2 mg/dL (ref 8.9–10.3)
Creatinine, Ser: 0.69 mg/dL (ref 0.44–1.00)
GFR calc non Af Amer: >60 mL/min (ref >60)
Glucose, Bld: 109 mg/dL — ABNORMAL HIGH (ref 65–99)
POTASSIUM: 3.9 mmol/L (ref 3.5–5.1)
Sodium: 140 mmol/L (ref 135–145)
Total Protein: 6.6 g/dL (ref 6.5–8.1)

## 2014-10-06 MED ORDER — SODIUM CHLORIDE 0.9 % IV SOLN
Freq: Once | INTRAVENOUS | Status: AC
  Administered 2014-10-06: 10:00:00 via INTRAVENOUS
  Filled 2014-10-06: qty 250

## 2014-10-06 MED ORDER — HEPARIN SOD (PORK) LOCK FLUSH 100 UNIT/ML IV SOLN
500.0000 [IU] | Freq: Once | INTRAVENOUS | Status: AC
  Administered 2014-10-06: 500 [IU] via INTRAVENOUS
  Filled 2014-10-06: qty 5

## 2014-10-06 MED ORDER — LOTEPREDNOL ETABONATE 0.5 % OP SUSP: 1.0000 [drp] | Freq: Four times a day (QID) | OPHTHALMIC | Status: DC

## 2014-10-06 MED ORDER — DOXORUBICIN HCL CHEMO IV INJECTION 2 MG/ML
100.0000 mg | Freq: Once | INTRAVENOUS | Status: AC
  Administered 2014-10-06: 100 mg via INTRAVENOUS
  Filled 2014-10-06: qty 50

## 2014-10-06 MED ORDER — SODIUM CHLORIDE 0.9 % IV SOLN
Freq: Once | INTRAVENOUS | Status: AC
  Administered 2014-10-06: 11:00:00 via INTRAVENOUS
  Filled 2014-10-06: qty 5

## 2014-10-06 MED ORDER — SODIUM CHLORIDE 0.9 % IV SOLN
1000.0000 mg | Freq: Once | INTRAVENOUS | Status: AC
  Administered 2014-10-06: 1000 mg via INTRAVENOUS
  Filled 2014-10-06: qty 50

## 2014-10-06 MED ORDER — HYPROMELLOSE (GONIOSCOPIC) 2.5 % OP SOLN: 1.0000 [drp] | Freq: Four times a day (QID) | OPHTHALMIC | Status: DC

## 2014-10-06 MED ORDER — SODIUM CHLORIDE 0.9 % IJ SOLN
10.0000 mL | INTRAMUSCULAR | Status: DC | PRN
  Administered 2014-10-06: 10 mL via INTRAVENOUS
  Filled 2014-10-06: qty 10

## 2014-10-06 MED ORDER — POTASSIUM CHLORIDE CRYS ER 20 MEQ PO TBCR: 20.0000 meq | EXTENDED_RELEASE_TABLET | Freq: Once | ORAL | Status: DC

## 2014-10-06 MED ORDER — PALONOSETRON HCL INJECTION 0.25 MG/5ML
0.2500 mg | Freq: Once | INTRAVENOUS | Status: AC
  Administered 2014-10-06: 0.25 mg via INTRAVENOUS
  Filled 2014-10-06: qty 5

## 2014-10-06 NOTE — Progress Notes (Signed)
[place] note   Referred by [facility]    This 59 y.o. female patient presents to the clinic for F/U BREAST CANCER,    Chief Complaint/Problem List: has Breast cancer and Depression on her problem list.  1. 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 ROTATOR CUFF TEAR LEFT ARM ON MRI 07/02/14, SYMPTOMS SINCE FALL OF 2015  3. SUICIDE ATTEMPT 06/2014  4. OVERDUE FOR SCREENING COLONOSCOPY  5. BORDERLINE HIGH ALK PHOS AND SGOT PRIOR NOW NORMAL   6. ALCOHOL ABUSE, HAS DENIED ALCOHOL USE BUT HIGH ETOH LEVEL AT TIME OF ADMISSION ,,7. ABSCESS TOOTH PRIOR   HPI: INITIAL OFFICE VISIT was 07/08/14 WITH HX AS ABOVE, ALSO SEE CONSULT 06/30/14 [facility]. NOTABLY, PATIENT ADMIDDED WITH IMPULSIVE ACT SUICIDE ATTEMPT SWALLOWED PILLS AT TIME OF FAMILY STRESS. BRIEFLY HOSPITALIZED BEHAVIORAL MEDICINE, NOW OUTPATIENT F/U. HX OF SEEKING MEDICAL ATTENTION FOR BREAST MASS IN 09/2013 IN [place], FOR LESION PRESENT BUT IGNORED FOR AS LONG AS THREE YRS. F/U INTERRUPTED. PATIENT MOVED, TO [place], NOW TO [place]. PATIENT NOW UNDERSTANDS AND IS COMMITTED TO TX OF BREAST CANCER. NO ACUTE COMPLAINTS. PAIN AND LIMITED ROM RIGHT SHOULDER SINCE FALL OF 2015.   HAS HAD PORT PLACED.  DID NOT START CHEMOTX 3/7. DELAY TO REVIEW OUTSIDE PATH SLIDES. LABS DONE. K WAS LOW. STARTED PO KCL . DAY 1 CYCLE 1 TX WAS 3/16.Marland KitchenMarland KitchenAS PRIOR NOTED, ON 3/22 HAD SYNCOPE, AND WENT TO ER. AT HOME HAD, LOWER ABDOMINAL AND SUPRAPUBIC CRAMPS AND DYSURIA, WAS ON TOILET URINATING, GOT UP, WAVE OF NAUSEA ,LATER TOOTH ABSCESS TREATED.    TODAY RETURNS FOR DAY 1 OF CYCLE 4   NO CURRENT ACUTE COMPLAINTS, NO SOB NO FEVER NO CHILLS NO EDEMA. Has had some problems.  Has eye irritation teary watery eyes with a feeling of film on the eyes but no pain no headache no other drainage from the eyes, this is been going on since after the first cycle of she did not report this prior she thinks it may be slightly worse recently . Also, after this last third cycle beginning few days after Neulasta she experienced a shoulder neck upper mid and low back and sacroiliac aching pain that is characteristic of Neulasta. This has not really faded and so still some persistent ache across the mid low back and across the sacroiliac even to today she has taken some when necessary Tylenol with modest relief the symptoms wax and wane but persist was no focal bone pain there is no leg or other limb weakness she persists in having limited a range of motion of right shoulder and pain in the right shoulder she did not experience any headache or dizziness no other stomatitis no diarrhea and no skin changes no shortness of breath no edema     Review of Systems:  General: No acute distress, No fatigue, No recent weight loss, fever chills or sweats   HEENT: No headache, dizziness, ear or jaw pain, or epistaxis   Lungs: No cough, shortness of breath, at rest, No SOBOE, No wheezing, No chest pain, No, hemoptysis  Cardiac: no chest pain, no palpitations, no orthostasis, no lower extremity  GI: no abdominal pain, nausea, vomiting, diahrrea, or reflux   GU: no dysuria, no hematuria, no vaginal bleeding  Musculoskeletal:  no acutely painful joints,   Extremities: no upper or lower extremity edema  Skin: no bruising, no rash  Neuro: no headache, no dizzy, no focal weakness   Psych: no anxiety, no depression   Allergies Allergies  Allergen Reactions  . No Known Allergies     Significant History/PMH: Past Medical History  Diagnosis Date  . Depression   . Cancer May 2015    right breast  . Breast cancer   . Hypertension   . Hypercholesteremia    Past Surgical History  Procedure  Laterality Date  . Abdominal hysterectomy  1981            Smoking History:  Current smoker, No interested in quitting, 1PPD  PFSH: Family History:  Family History  Problem Relation Age of Onset  . Hypertension Mother   . Diabetes Mother     Comments:   Social History:  History  Alcohol Use  . 0.0 oz/week  . 0 Standard drinks or equivalent per week    Additional Past Medical and Surgical History:    Home Medications: Prior to Admission medications   Medication Sig Start Date End Date Taking? Authorizing Provider  FLUoxetine (PROZAC) 20 MG tablet Take 20 mg by mouth daily.   Yes Historical Provider, MD  lidocaine-prilocaine (EMLA) cream Apply 1 application topically as needed. 10/04/14  Yes Dallas Schimke, MD  ondansetron (ZOFRAN) 4 MG tablet Take 4 mg by mouth every 8 (eight) hours as needed for nausea or vomiting.   Yes Historical Provider, MD  potassium chloride SA (K-DUR,KLOR-CON) 20 MEQ tablet Take 20 mEq by mouth once.   Yes Historical Provider, MD  traZODone (DESYREL) 100 MG tablet Take 100 mg by mouth as needed for sleep.   Yes Historical Provider, MD  HYDROcodone-acetaminophen (NORCO/VICODIN) 5-325 MG per tablet Take 1 tablet by mouth every 4 (four) hours as needed for moderate pain.    Historical Provider, MD  oxyCODONE-acetaminophen (PERCOCET/ROXICET) 5-325 MG per tablet Take 1 tablet by mouth every 4 (four) hours as needed for severe pain.    Historical Provider, MD  phenazopyridine (PYRIDIUM) 100 MG tablet Take 100 mg by mouth 2 (two) times daily.    Historical Provider, MD    Vital Signs:  Blood pressure 152/98, pulse 78, temperature 98.3 F (36.8 C), temperature source Tympanic, resp. rate 16, weight 145 lb 8.1 oz (66 kg), SpO2 98 %.  Physical Exam:  General: well developed, well nourished, and no acute distress  Mental Status: alert and oriented to person, place and time  Head, Ears, Nose,Throat: No thrush.. Conjunctiva slightly red eyes slightly watery    Respiratory: no rales, rhonchi, or wheezing, no dullness  Cardiovascular: regular rate and rhythm  Gastrointestinal: soft, non tender, no masses or organomegaly  Musculoskeletal: no lower extremity edema no calf tenderness  Skin: no rashes, no bruises  Neurological: No gross focal weakness cranial nerves intact  Lymphatics: Not palpable, neck supraclavicular, submandibular, axilla    Psych: Mood, Affect, Unremarkable   BREAST the right breast mass is substantially smaller still felt firm and deep 12:00 position    Laboratory Results: Infusion on 10/06/2014  Component Date Value Ref Range Status  . WBC 10/06/2014 6.7  3.6 - 11.0 K/uL Final   A-LINE DRAW  . RBC 10/06/2014 3.85  3.80 - 5.20 MIL/uL Final  . Hemoglobin 10/06/2014 12.4  12.0 -  16.0 g/dL Final  . HCT 10/06/2014 37.7  35.0 - 47.0 % Final  . MCV 10/06/2014 98.1  80.0 - 100.0 fL Final  . MCH 10/06/2014 32.4  26.0 - 34.0 pg Final  . MCHC 10/06/2014 33.0  32.0 - 36.0 g/dL Final  . RDW 10/06/2014 18.1* 11.5 - 14.5 % Final  . Platelets 10/06/2014 271  150 - 440 K/uL Final  . Neutrophils Relative % 10/06/2014 64   Final  . Neutro Abs 10/06/2014 4.3  1.4 - 6.5 K/uL Final  . Lymphocytes Relative 10/06/2014 20   Final  . Lymphs Abs 10/06/2014 1.3  1.0 - 3.6 K/uL Final  . Monocytes Relative 10/06/2014 11   Final  . Monocytes Absolute 10/06/2014 0.7  0.2 - 0.9 K/uL Final  . Eosinophils Relative 10/06/2014 4   Final  . Eosinophils Absolute 10/06/2014 0.2  0 - 0.7 K/uL Final  . Basophils Relative 10/06/2014 1   Final  . Basophils Absolute 10/06/2014 0.1  0 - 0.1 K/uL Final  . Sodium 10/06/2014 140  135 - 145 mmol/L Final  . Potassium 10/06/2014 3.9  3.5 - 5.1 mmol/L Final  . Chloride 10/06/2014 108  101 - 111 mmol/L Final  . CO2 10/06/2014 27  22 - 32 mmol/L Final  . Glucose, Bld 10/06/2014 109* 65 - 99 mg/dL Final  . BUN 10/06/2014 8  6 - 20 mg/dL Final  . Creatinine, Ser 10/06/2014 0.69  0.44 - 1.00 mg/dL Final  . Calcium  10/06/2014 9.2  8.9 - 10.3 mg/dL Final  . Total Protein 10/06/2014 6.6  6.5 - 8.1 g/dL Final  . Albumin 10/06/2014 3.8  3.5 - 5.0 g/dL Final  . AST 10/06/2014 14* 15 - 41 U/L Final  . ALT 10/06/2014 9* 14 - 54 U/L Final  . Alkaline Phosphatase 10/06/2014 94  38 - 126 U/L Final  . Total Bilirubin 10/06/2014 0.3  0.3 - 1.2 mg/dL Final  . GFR calc non Af Amer 10/06/2014 >60  >60 mL/min Final  . GFR calc Af Amer 10/06/2014 >60  >60 mL/min Final   Comment: (NOTE) The eGFR has been calculated using the CKD EPI equation. This calculation has not been validated in all clinical situations. eGFR's persistently <60 mL/min signify possible Chronic Kidney Disease.   . Anion gap 10/06/2014 5  5 - 15 Final          Radiology Results: No results found.         Assessment and Plan: Impression: SEE ALSO C URRENT ILLNESS 1. T3 N1 CLINICAL, BUT BX NEG IN 10/2014, M0, ER POS PR NEG HER 2 NEG BREAST CANCER, CEA AND CA 27/29 NORMAL, M0 STAGE 2A     2... TORN ROTATOR CUFF    3. RECENT SUICIDE ATTEMPT, MOOD, AFFECT, CALM. ON MEDICATION. NO THOUGHTS OF SUICIDE.   4. ETOH ABUSE  5. SMOKER  6. OVERDUE FOR COLONOSCOPY ON HOLD NOW, PATIENT DOES NOT WANT TO ADRESS NOW     PRIOR  ECHO UNREMARKABLE.  TODAY LABS STABLE, breast mass smaller, note prior history of resolved uti, syncope /vagal episode, tooth abscess, neulasta likely contributing to significsnt recruitment of wbc at site. Today he has issues of eye irritation. Not usually a prominent effect of Cytoxan and Adriamycin. No evidence of significant pathology. Discussed with ophthalmology. Planning palliative treatment with eyedrops. The breast mass is significantly improved with each treatment. Patient does have bone pain that's very classic for Neulasta  lingering now on day 1 of the next cycle.Ok  for day 1 cycle 4. Has mild high blood pressure will watch. In the past patient was on lisinopril 20 mg daily and Norvasc 5 mg daily that had been stopped prior to  treatment    Plan: Go ahead with day 1 cycle for treatment today same dose of Cytoxan and Adriamycin same premedications. Watch blood pressure. Consider restarting Norvasc 5 mg. Returns tomorrow for Neulasta will continue to take Claritin and take that for a whole week. We used Tylenol when necessary for symptoms. We'll call when necessary for any worsening symptoms. We'll also start artificial tears and Lotemax drops both eyes. Continue weekly lab check. Reevaluate clinically next week. Taxol weekly 12 weeks is due to begin in 3 weeks or June 8. Before June 8 would get a repeat echocardiogram

## 2014-10-06 NOTE — Telephone Encounter (Signed)
Refill for potassium e-scribed to the patient's pharmacy.

## 2014-10-07 ENCOUNTER — Inpatient Hospital Stay: Payer: BLUE CROSS/BLUE SHIELD

## 2014-10-07 VITALS — BP 145/82 | HR 74 | Temp 96.3°F | Resp 18

## 2014-10-07 DIAGNOSIS — Z5111 Encounter for antineoplastic chemotherapy: Secondary | ICD-10-CM | POA: Diagnosis not present

## 2014-10-07 DIAGNOSIS — C50911 Malignant neoplasm of unspecified site of right female breast: Secondary | ICD-10-CM

## 2014-10-07 MED ORDER — PEGFILGRASTIM INJECTION 6 MG/0.6ML ~~LOC~~
PREFILLED_SYRINGE | SUBCUTANEOUS | Status: AC
  Filled 2014-10-07: qty 0.6

## 2014-10-07 MED ORDER — PEGFILGRASTIM INJECTION 6 MG/0.6ML
6.0000 mg | Freq: Once | SUBCUTANEOUS | Status: AC
  Administered 2014-10-07: 6 mg via SUBCUTANEOUS
  Filled 2014-10-07: qty 0.6

## 2014-10-12 ENCOUNTER — Other Ambulatory Visit: Payer: Self-pay | Admitting: Family Medicine

## 2014-10-13 ENCOUNTER — Ambulatory Visit: Payer: Medicaid Other | Admitting: Internal Medicine

## 2014-10-13 ENCOUNTER — Other Ambulatory Visit: Payer: Medicaid Other

## 2014-10-19 ENCOUNTER — Inpatient Hospital Stay: Payer: BLUE CROSS/BLUE SHIELD

## 2014-10-19 ENCOUNTER — Other Ambulatory Visit: Payer: Self-pay | Admitting: Family Medicine

## 2014-10-19 DIAGNOSIS — C50919 Malignant neoplasm of unspecified site of unspecified female breast: Secondary | ICD-10-CM

## 2014-10-19 DIAGNOSIS — Z5111 Encounter for antineoplastic chemotherapy: Secondary | ICD-10-CM | POA: Diagnosis not present

## 2014-10-19 LAB — CBC WITH DIFFERENTIAL/PLATELET
Basophils Absolute: 0.1 10*3/uL (ref 0–0.1)
Basophils Relative: 1 %
EOS PCT: 1 %
Eosinophils Absolute: 0 10*3/uL (ref 0–0.7)
HCT: 35.6 % (ref 35.0–47.0)
Hemoglobin: 11.6 g/dL — ABNORMAL LOW (ref 12.0–16.0)
LYMPHS PCT: 23 %
Lymphs Abs: 1.4 10*3/uL (ref 1.0–3.6)
MCH: 32.5 pg (ref 26.0–34.0)
MCHC: 32.6 g/dL (ref 32.0–36.0)
MCV: 99.6 fL (ref 80.0–100.0)
MONOS PCT: 14 %
Monocytes Absolute: 0.8 10*3/uL (ref 0.2–0.9)
NEUTROS ABS: 3.8 10*3/uL (ref 1.4–6.5)
NEUTROS PCT: 61 %
Platelets: 191 10*3/uL (ref 150–440)
RBC: 3.57 MIL/uL — ABNORMAL LOW (ref 3.80–5.20)
RDW: 17.4 % — AB (ref 11.5–14.5)
WBC: 6.2 10*3/uL (ref 3.6–11.0)

## 2014-10-19 LAB — COMPREHENSIVE METABOLIC PANEL
ALK PHOS: 112 U/L (ref 38–126)
ALT: 7 U/L — ABNORMAL LOW (ref 14–54)
AST: 12 U/L — AB (ref 15–41)
Albumin: 3.7 g/dL (ref 3.5–5.0)
Anion gap: 9 (ref 5–15)
BUN: <5 mg/dL — ABNORMAL LOW (ref 6–20)
CO2: 28 mmol/L (ref 22–32)
CREATININE: 0.71 mg/dL (ref 0.44–1.00)
Calcium: 8.9 mg/dL (ref 8.9–10.3)
Chloride: 107 mmol/L (ref 101–111)
GFR calc Af Amer: >60 mL/min (ref >60)
Glucose, Bld: 91 mg/dL (ref 65–99)
Potassium: 3.5 mmol/L (ref 3.5–5.1)
SODIUM: 144 mmol/L (ref 135–145)
Total Bilirubin: 0.2 mg/dL — ABNORMAL LOW (ref 0.3–1.2)
Total Protein: 6.9 g/dL (ref 6.5–8.1)

## 2014-10-20 ENCOUNTER — Other Ambulatory Visit: Payer: Self-pay | Admitting: Family Medicine

## 2014-10-20 ENCOUNTER — Other Ambulatory Visit: Payer: Self-pay | Admitting: Oncology

## 2014-10-27 ENCOUNTER — Inpatient Hospital Stay: Payer: BLUE CROSS/BLUE SHIELD

## 2014-10-27 ENCOUNTER — Inpatient Hospital Stay: Payer: BLUE CROSS/BLUE SHIELD | Attending: Internal Medicine | Admitting: Family Medicine

## 2014-10-27 ENCOUNTER — Encounter: Payer: Self-pay | Admitting: Family Medicine

## 2014-10-27 ENCOUNTER — Encounter: Payer: Self-pay | Admitting: Oncology

## 2014-10-27 VITALS — BP 138/86 | HR 90 | Temp 96.9°F | Resp 16 | Wt 144.4 lb

## 2014-10-27 DIAGNOSIS — Z915 Personal history of self-harm: Secondary | ICD-10-CM

## 2014-10-27 DIAGNOSIS — R200 Anesthesia of skin: Secondary | ICD-10-CM | POA: Diagnosis not present

## 2014-10-27 DIAGNOSIS — Z170 Estrogen receptor positive status [ER+]: Secondary | ICD-10-CM | POA: Diagnosis not present

## 2014-10-27 DIAGNOSIS — F1010 Alcohol abuse, uncomplicated: Secondary | ICD-10-CM | POA: Insufficient documentation

## 2014-10-27 DIAGNOSIS — F17210 Nicotine dependence, cigarettes, uncomplicated: Secondary | ICD-10-CM | POA: Insufficient documentation

## 2014-10-27 DIAGNOSIS — Z5111 Encounter for antineoplastic chemotherapy: Secondary | ICD-10-CM | POA: Diagnosis not present

## 2014-10-27 DIAGNOSIS — F329 Major depressive disorder, single episode, unspecified: Secondary | ICD-10-CM | POA: Insufficient documentation

## 2014-10-27 DIAGNOSIS — C50919 Malignant neoplasm of unspecified site of unspecified female breast: Secondary | ICD-10-CM

## 2014-10-27 DIAGNOSIS — R451 Restlessness and agitation: Secondary | ICD-10-CM

## 2014-10-27 DIAGNOSIS — R202 Paresthesia of skin: Secondary | ICD-10-CM | POA: Diagnosis not present

## 2014-10-27 DIAGNOSIS — I10 Essential (primary) hypertension: Secondary | ICD-10-CM | POA: Diagnosis not present

## 2014-10-27 DIAGNOSIS — C50911 Malignant neoplasm of unspecified site of right female breast: Secondary | ICD-10-CM

## 2014-10-27 DIAGNOSIS — E785 Hyperlipidemia, unspecified: Secondary | ICD-10-CM | POA: Insufficient documentation

## 2014-10-27 LAB — COMPREHENSIVE METABOLIC PANEL
ALT: 6 U/L — ABNORMAL LOW (ref 14–54)
AST: 13 U/L — ABNORMAL LOW (ref 15–41)
Albumin: 3.8 g/dL (ref 3.5–5.0)
Alkaline Phosphatase: 104 U/L (ref 38–126)
Anion gap: 5 (ref 5–15)
BUN: 10 mg/dL (ref 6–20)
CALCIUM: 9.2 mg/dL (ref 8.9–10.3)
CO2: 25 mmol/L (ref 22–32)
CREATININE: 0.66 mg/dL (ref 0.44–1.00)
Chloride: 111 mmol/L (ref 101–111)
GFR calc non Af Amer: >60 mL/min (ref >60)
GLUCOSE: 95 mg/dL (ref 65–99)
Potassium: 4 mmol/L (ref 3.5–5.1)
Sodium: 141 mmol/L (ref 135–145)
TOTAL PROTEIN: 6.9 g/dL (ref 6.5–8.1)
Total Bilirubin: 0.3 mg/dL (ref 0.3–1.2)

## 2014-10-27 LAB — CBC WITH DIFFERENTIAL/PLATELET
BASOS ABS: 0 10*3/uL (ref 0–0.1)
BASOS PCT: 0 %
EOS PCT: 1 %
Eosinophils Absolute: 0.1 10*3/uL (ref 0–0.7)
HCT: 38.4 % (ref 35.0–47.0)
Hemoglobin: 12.6 g/dL (ref 12.0–16.0)
Lymphocytes Relative: 23 %
Lymphs Abs: 1.5 10*3/uL (ref 1.0–3.6)
MCH: 32.7 pg (ref 26.0–34.0)
MCHC: 32.9 g/dL (ref 32.0–36.0)
MCV: 99.4 fL (ref 80.0–100.0)
MONO ABS: 1 10*3/uL — AB (ref 0.2–0.9)
MONOS PCT: 15 %
NEUTROS PCT: 61 %
Neutro Abs: 3.9 10*3/uL (ref 1.4–6.5)
Platelets: 340 10*3/uL (ref 150–440)
RBC: 3.86 MIL/uL (ref 3.80–5.20)
RDW: 17.6 % — AB (ref 11.5–14.5)
WBC: 6.6 10*3/uL (ref 3.6–11.0)

## 2014-10-27 MED ORDER — ALPRAZOLAM 0.5 MG PO TABS
0.5000 mg | ORAL_TABLET | Freq: Once | ORAL | Status: AC
  Administered 2014-10-27: 0.5 mg via ORAL
  Filled 2014-10-27: qty 1

## 2014-10-27 MED ORDER — SODIUM CHLORIDE 0.9 % IV SOLN
Freq: Once | INTRAVENOUS | Status: AC
  Administered 2014-10-27: 11:00:00 via INTRAVENOUS
  Filled 2014-10-27: qty 4

## 2014-10-27 MED ORDER — SODIUM CHLORIDE 0.9 % IV SOLN
Freq: Once | INTRAVENOUS | Status: AC
  Administered 2014-10-27: 11:00:00 via INTRAVENOUS
  Filled 2014-10-27: qty 1000

## 2014-10-27 MED ORDER — SODIUM CHLORIDE 0.9 % IJ SOLN
10.0000 mL | INTRAMUSCULAR | Status: DC | PRN
  Administered 2014-10-27: 10 mL
  Filled 2014-10-27: qty 10

## 2014-10-27 MED ORDER — LOTEPREDNOL ETABONATE 0.5 % OP SUSP: 1.0000 [drp] | Freq: Four times a day (QID) | OPHTHALMIC | Status: DC

## 2014-10-27 MED ORDER — FAMOTIDINE IN NACL 20-0.9 MG/50ML-% IV SOLN
20.0000 mg | Freq: Once | INTRAVENOUS | Status: AC
  Administered 2014-10-27: 20 mg via INTRAVENOUS
  Filled 2014-10-27: qty 50

## 2014-10-27 MED ORDER — DIPHENHYDRAMINE HCL 50 MG/ML IJ SOLN
50.0000 mg | Freq: Once | INTRAMUSCULAR | Status: AC
  Administered 2014-10-27: 50 mg via INTRAVENOUS
  Filled 2014-10-27: qty 1

## 2014-10-27 MED ORDER — PACLITAXEL CHEMO INJECTION 300 MG/50ML
80.0000 mg/m2 | Freq: Once | INTRAVENOUS | Status: AC
  Administered 2014-10-27: 138 mg via INTRAVENOUS
  Filled 2014-10-27: qty 23

## 2014-10-27 MED ORDER — HEPARIN SOD (PORK) LOCK FLUSH 100 UNIT/ML IV SOLN
500.0000 [IU] | Freq: Once | INTRAVENOUS | Status: AC | PRN
  Administered 2014-10-27: 500 [IU]
  Filled 2014-10-27 (×2): qty 5

## 2014-10-27 MED ORDER — HYPROMELLOSE (GONIOSCOPIC) 2.5 % OP SOLN: 1.0000 [drp] | Freq: Four times a day (QID) | OPHTHALMIC | Status: DC

## 2014-10-27 NOTE — Progress Notes (Signed)
Gina Deleon  [phone]) [phone]  [phone]     Gina Deleon DOB: 1956/04/23  MR#: [id number]  INO#:[id number]  Patient Care Team: No Pcp Per Patient as PCP - General (General Practice) Dallas Schimke, MD (Internal Medicine) Robert Bellow, MD (General Surgery)  CHIEF COMPLAINT:  Chief Complaint  Patient presents with  . Chemotherapy Follow Up    Is scheduled to receive Taxol today...   Patient with history of breast cancer, ER positive PR negative HER-2 negative and significant depression, patient attempted suicide in February 2016, also with extensive history of alcohol abuse.  INTERVAL HISTORY:  Patient presents today with mild complaint of numbness and tingling in feet. She is completed 4 of 4 cycles of Cytoxan and Adriamycin for treatment of breast cancer. Today she is to start cycle 1 of 12 weekly doses of Taxol. She overall feels well, denies any suicidal ideation.  REVIEW OF SYSTEMS:   Review of Systems  Constitutional: Negative for fever, chills, weight loss, malaise/fatigue and diaphoresis.  HENT: Negative for congestion, ear discharge, ear pain, hearing loss, nosebleeds, sore throat and tinnitus.   Eyes: Negative for blurred vision, double vision, photophobia, pain, discharge and redness.  Respiratory: Negative for cough, hemoptysis, sputum production, shortness of breath, wheezing and stridor.   Cardiovascular: Negative for chest pain, palpitations, orthopnea, claudication, leg swelling and PND.  Gastrointestinal: Negative for heartburn, nausea, vomiting, abdominal pain, diarrhea, constipation, blood in stool and melena.  Genitourinary: Negative.   Musculoskeletal: Negative.   Skin: Negative.   Neurological: Negative for dizziness, tingling, focal weakness, seizures, weakness and headaches.  Endo/Heme/Allergies: Does not bruise/bleed easily.  Psychiatric/Behavioral: Negative for depression. The patient is not nervous/anxious and does not  have insomnia.     As per HPI. Otherwise, a complete review of systems is negatve.  ONCOLOGY HISTORY:  No history exists.    PAST MEDICAL HISTORY: Past Medical History  Diagnosis Date  . Depression   . Cancer May 2015    right breast  . Breast cancer   . Hypertension   . Hypercholesteremia     PAST SURGICAL HISTORY: Past Surgical History  Procedure Laterality Date  . Abdominal hysterectomy  1981    FAMILY HISTORY Family History  Problem Relation Age of Onset  . Hypertension Mother   . Diabetes Mother     GYNECOLOGIC HISTORY:  No LMP recorded. Patient has had a hysterectomy.     ADVANCED DIRECTIVES:    HEALTH MAINTENANCE: History  Substance Use Topics  . Smoking status: Current Every Day Smoker -- 1.00 packs/day for 20 years    Types: Cigarettes  . Smokeless tobacco: Not on file  . Alcohol Use: 0.0 oz/week    0 Standard drinks or equivalent per week     Colonoscopy:  PAP:  Bone density:  Lipid panel:  Allergies  Allergen Reactions  . No Known Allergies     Current Outpatient Prescriptions  Medication Sig Dispense Refill  . FLUoxetine (PROZAC) 20 MG tablet Take 20 mg by mouth daily.    Marland Kitchen HYDROcodone-acetaminophen (NORCO/VICODIN) 5-325 MG per tablet Take 1 tablet by mouth every 4 (four) hours as needed for moderate pain.    . hydroxypropyl methylcellulose / hypromellose (ISOPTO TEARS / GONIOVISC) 2.5 % ophthalmic solution Place 1 drop into both eyes 4 (four) times daily. 15 mL 1  . lidocaine-prilocaine (EMLA) cream Apply 1 application topically as needed. 30 g 0  . loteprednol (LOTEMAX) 0.5 % ophthalmic suspension Place 1 drop into both eyes  4 (four) times daily. 5 mL 0  . ondansetron (ZOFRAN) 4 MG tablet Take 4 mg by mouth every 8 (eight) hours as needed for nausea or vomiting.    . phenazopyridine (PYRIDIUM) 100 MG tablet Take 100 mg by mouth 2 (two) times daily.    . potassium chloride SA (K-DUR,KLOR-CON) 20 MEQ tablet Take 1 tablet (20 mEq total)  by mouth once. Take 20 mEq by mouth once. 30 tablet 5  . traZODone (DESYREL) 100 MG tablet Take 100 mg by mouth as needed for sleep.    Marland Kitchen oxyCODONE-acetaminophen (PERCOCET/ROXICET) 5-325 MG per tablet Take 1 tablet by mouth every 4 (four) hours as needed for severe pain.     No current facility-administered medications for this visit.    OBJECTIVE: BP 138/86 mmHg  Pulse 90  Temp(Src) 96.9 F (36.1 C) (Tympanic)  Resp 16  Wt 144 lb 6.4 oz (65.5 kg)  SpO2 97%   Body mass index is 23.35 kg/(m^2).    ECOG FS:1 - Symptomatic but completely ambulatory  General: Well-developed, well-nourished, no acute distress. Eyes: Pink conjunctiva, anicteric sclera. HEENT: Normocephalic, moist mucous membranes, clear oropharnyx. Lungs: Clear to auscultation bilaterally. Heart: Regular rate and rhythm. No rubs, murmurs, or gallops. Abdomen: Soft, nontender, nondistended. No organomegaly noted, normoactive bowel sounds. Musculoskeletal: No edema, cyanosis, or clubbing. Neuro: Alert, answering all questions appropriately. Cranial nerves grossly intact. Mild, grade 1, peripheral neuropathy. Skin: No rashes or petechiae noted. Psych: Normal affect. Lymphatics: No cervical, calvicular, axillary or inguinal LAD.   LAB RESULTS:     Component Value Date/Time   NA 144 10/19/2014 1647   NA 139 09/15/2014 0830   K 3.5 10/19/2014 1647   K 3.9 09/15/2014 0830   CL 107 10/19/2014 1647   CL 109 09/15/2014 0830   CO2 28 10/19/2014 1647   CO2 27 09/15/2014 0830   GLUCOSE 91 10/19/2014 1647   GLUCOSE 90 09/15/2014 0830   BUN <5* 10/19/2014 1647   BUN 7 09/15/2014 0830   CREATININE 0.71 10/19/2014 1647   CREATININE 0.65 09/15/2014 0830   CALCIUM 8.9 10/19/2014 1647   CALCIUM 9.1 09/15/2014 0830   PROT 6.9 10/19/2014 1647   PROT 6.9 09/15/2014 0830   ALBUMIN 3.7 10/19/2014 1647   ALBUMIN 3.7 09/15/2014 0830   AST 12* 10/19/2014 1647   AST 11* 09/15/2014 0830   ALT 7* 10/19/2014 1647   ALT 7*  09/15/2014 0830   ALKPHOS 112 10/19/2014 1647   ALKPHOS 79 09/15/2014 0830   BILITOT 0.2* 10/19/2014 1647   GFRNONAA >60 10/19/2014 1647   GFRNONAA >60 09/15/2014 0830   GFRAA >60 10/19/2014 1647   GFRAA >60 09/15/2014 0830    No results found for: SPEP, UPEP  Lab Results  Component Value Date   WBC 6.6 10/27/2014   NEUTROABS 3.9 10/27/2014   HGB 12.6 10/27/2014   HCT 38.4 10/27/2014   MCV 99.4 10/27/2014   PLT 340 10/27/2014      Chemistry      Component Value Date/Time   NA 144 10/19/2014 1647   NA 139 09/15/2014 0830   K 3.5 10/19/2014 1647   K 3.9 09/15/2014 0830   CL 107 10/19/2014 1647   CL 109 09/15/2014 0830   CO2 28 10/19/2014 1647   CO2 27 09/15/2014 0830   BUN <5* 10/19/2014 1647   BUN 7 09/15/2014 0830   CREATININE 0.71 10/19/2014 1647   CREATININE 0.65 09/15/2014 0830      Component Value Date/Time   CALCIUM  8.9 10/19/2014 1647   CALCIUM 9.1 09/15/2014 0830   ALKPHOS 112 10/19/2014 1647   ALKPHOS 79 09/15/2014 0830   AST 12* 10/19/2014 1647   AST 11* 09/15/2014 0830   ALT 7* 10/19/2014 1647   ALT 7* 09/15/2014 0830   BILITOT 0.2* 10/19/2014 1647       No results found for: LABCA2  No components found for: [id number]  No results for input(s): INR in the last 168 hours.  No results found for: COLORURINE, APPEARANCEUR, LABSPEC, PHURINE, GLUCOSEU, HGBUR, BILIRUBINUR, KETONESUR, PROTEINUR, UROBILINOGEN, NITRITE, LEUKOCYTESUR  STUDIES: No results found.  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 Breast CA. Patient has completed 4 of 4 cycles of Cytoxan and Adriamycin. She is now due for Taxol weekly 12 weeks. We'll proceed with cycle one therapy today as labs are sufficient for treatment. Patient has been instructed that if neuropathy worsens she is to let provider know as side effects of Taxol can be permanent she reports understanding and is agreeable. She will return in one week for cycle  2 of 12 Taxol. In 2 weeks she will return for further evaluation and continuation of Taxol at that time as well   Patient expressed understanding and was in agreement with this plan. She also understands that She can call clinic at any time with any questions, concerns, or complaints.     Evlyn Kanner, NP   10/27/2014 10:06 AM

## 2014-10-29 NOTE — Progress Notes (Signed)
Oncology Nurse Navigator Documentation  Oncology Nurse Navigator Flowsheets 10/29/2014  Navigator Encounter Type Treatment  Treatment Phase Treatment  Barriers/Navigation Needs Financial;Family concerns  Time Spent with Patient 89   Met patient during chemotherapy treatment.  Patient anxious.  States her daughter, whom she lives with, is moving to [place], and she is needing assistance with housing. Worked with [employer].  Gave her listings of [employer] living places, and offered to pay first months rent if she is accepted.  States her daughter will help her contact these places.  Patient states her disability application was denied, but she is submitting application again.  States he Medicaid was approved.

## 2014-11-03 ENCOUNTER — Ambulatory Visit: Payer: Medicaid Other

## 2014-11-03 ENCOUNTER — Inpatient Hospital Stay: Payer: BLUE CROSS/BLUE SHIELD

## 2014-11-03 VITALS — BP 144/95 | HR 90 | Temp 96.9°F | Resp 18

## 2014-11-03 DIAGNOSIS — F329 Major depressive disorder, single episode, unspecified: Secondary | ICD-10-CM

## 2014-11-03 DIAGNOSIS — C50919 Malignant neoplasm of unspecified site of unspecified female breast: Secondary | ICD-10-CM

## 2014-11-03 DIAGNOSIS — F32A Depression, unspecified: Secondary | ICD-10-CM

## 2014-11-03 DIAGNOSIS — Z5111 Encounter for antineoplastic chemotherapy: Secondary | ICD-10-CM | POA: Diagnosis not present

## 2014-11-03 LAB — CBC WITH DIFFERENTIAL/PLATELET
BASOS ABS: 0.1 10*3/uL (ref 0–0.1)
BASOS PCT: 3 %
Eosinophils Absolute: 0.2 10*3/uL (ref 0–0.7)
Eosinophils Relative: 5 %
HCT: 36.6 % (ref 35.0–47.0)
Hemoglobin: 11.9 g/dL — ABNORMAL LOW (ref 12.0–16.0)
Lymphocytes Relative: 34 %
Lymphs Abs: 1.8 10*3/uL (ref 1.0–3.6)
MCH: 32.5 pg (ref 26.0–34.0)
MCHC: 32.5 g/dL (ref 32.0–36.0)
MCV: 99.7 fL (ref 80.0–100.0)
MONO ABS: 0.7 10*3/uL (ref 0.2–0.9)
Monocytes Relative: 13 %
Neutro Abs: 2.4 10*3/uL (ref 1.4–6.5)
Neutrophils Relative %: 45 %
Platelets: 262 10*3/uL (ref 150–440)
RBC: 3.67 MIL/uL — ABNORMAL LOW (ref 3.80–5.20)
RDW: 16.9 % — AB (ref 11.5–14.5)
WBC: 5.2 10*3/uL (ref 3.6–11.0)

## 2014-11-03 MED ORDER — HEPARIN SOD (PORK) LOCK FLUSH 100 UNIT/ML IV SOLN
500.0000 [IU] | Freq: Once | INTRAVENOUS | Status: DC
  Filled 2014-11-03: qty 5

## 2014-11-03 MED ORDER — PACLITAXEL CHEMO INJECTION 300 MG/50ML
80.0000 mg/m2 | Freq: Once | INTRAVENOUS | Status: AC
  Administered 2014-11-03: 138 mg via INTRAVENOUS
  Filled 2014-11-03: qty 23

## 2014-11-03 MED ORDER — SODIUM CHLORIDE 0.9 % IV SOLN
Freq: Once | INTRAVENOUS | Status: AC
  Administered 2014-11-03: 16:00:00 via INTRAVENOUS
  Filled 2014-11-03: qty 4

## 2014-11-03 MED ORDER — FAMOTIDINE IN NACL 20-0.9 MG/50ML-% IV SOLN
20.0000 mg | Freq: Once | INTRAVENOUS | Status: AC
  Administered 2014-11-03: 20 mg via INTRAVENOUS
  Filled 2014-11-03: qty 50

## 2014-11-03 MED ORDER — DIPHENHYDRAMINE HCL 50 MG/ML IJ SOLN: 50.0000 mg | Freq: Once | INTRAMUSCULAR | Status: DC

## 2014-11-03 MED ORDER — SODIUM CHLORIDE 0.9 % IJ SOLN
10.0000 mL | INTRAMUSCULAR | Status: DC | PRN
  Filled 2014-11-03: qty 10

## 2014-11-03 MED ORDER — HEPARIN SOD (PORK) LOCK FLUSH 100 UNIT/ML IV SOLN: 500.0000 [IU] | Freq: Once | INTRAVENOUS | Status: AC | PRN

## 2014-11-03 MED ORDER — SODIUM CHLORIDE 0.9 % IJ SOLN
10.0000 mL | INTRAMUSCULAR | Status: DC | PRN
  Administered 2014-11-03: 10 mL via INTRAVENOUS
  Filled 2014-11-03: qty 10

## 2014-11-03 MED ORDER — DIPHENHYDRAMINE HCL 50 MG/ML IJ SOLN
25.0000 mg | Freq: Once | INTRAMUSCULAR | Status: AC
  Administered 2014-11-03: 25 mg via INTRAVENOUS
  Filled 2014-11-03: qty 1

## 2014-11-03 MED ORDER — SODIUM CHLORIDE 0.9 % IV SOLN
Freq: Once | INTRAVENOUS | Status: AC
  Administered 2014-11-03: 16:00:00 via INTRAVENOUS
  Filled 2014-11-03: qty 1000

## 2014-11-10 ENCOUNTER — Encounter: Payer: Self-pay | Admitting: Oncology

## 2014-11-10 ENCOUNTER — Inpatient Hospital Stay (HOSPITAL_BASED_OUTPATIENT_CLINIC_OR_DEPARTMENT_OTHER): Payer: BLUE CROSS/BLUE SHIELD | Admitting: Family Medicine

## 2014-11-10 ENCOUNTER — Inpatient Hospital Stay: Payer: BLUE CROSS/BLUE SHIELD

## 2014-11-10 ENCOUNTER — Other Ambulatory Visit: Payer: Self-pay | Admitting: Family Medicine

## 2014-11-10 ENCOUNTER — Encounter: Payer: Self-pay | Admitting: Family Medicine

## 2014-11-10 VITALS — BP 152/101 | HR 93 | Temp 98.1°F | Resp 20 | Wt 149.0 lb

## 2014-11-10 VITALS — BP 160/98 | HR 86

## 2014-11-10 DIAGNOSIS — F329 Major depressive disorder, single episode, unspecified: Secondary | ICD-10-CM | POA: Diagnosis not present

## 2014-11-10 DIAGNOSIS — R202 Paresthesia of skin: Secondary | ICD-10-CM

## 2014-11-10 DIAGNOSIS — E785 Hyperlipidemia, unspecified: Secondary | ICD-10-CM

## 2014-11-10 DIAGNOSIS — C50911 Malignant neoplasm of unspecified site of right female breast: Secondary | ICD-10-CM

## 2014-11-10 DIAGNOSIS — Z915 Personal history of self-harm: Secondary | ICD-10-CM

## 2014-11-10 DIAGNOSIS — R200 Anesthesia of skin: Secondary | ICD-10-CM

## 2014-11-10 DIAGNOSIS — F17210 Nicotine dependence, cigarettes, uncomplicated: Secondary | ICD-10-CM

## 2014-11-10 DIAGNOSIS — I10 Essential (primary) hypertension: Secondary | ICD-10-CM

## 2014-11-10 DIAGNOSIS — C50919 Malignant neoplasm of unspecified site of unspecified female breast: Secondary | ICD-10-CM

## 2014-11-10 DIAGNOSIS — F1010 Alcohol abuse, uncomplicated: Secondary | ICD-10-CM | POA: Diagnosis not present

## 2014-11-10 DIAGNOSIS — Z170 Estrogen receptor positive status [ER+]: Secondary | ICD-10-CM

## 2014-11-10 DIAGNOSIS — Z5111 Encounter for antineoplastic chemotherapy: Secondary | ICD-10-CM | POA: Diagnosis not present

## 2014-11-10 DIAGNOSIS — C801 Malignant (primary) neoplasm, unspecified: Secondary | ICD-10-CM

## 2014-11-10 LAB — CBC WITH DIFFERENTIAL/PLATELET
Basophils Absolute: 0 10*3/uL (ref 0–0.1)
Basophils Relative: 0 %
EOS ABS: 0.3 10*3/uL (ref 0–0.7)
EOS PCT: 5 %
HCT: 36.6 % (ref 35.0–47.0)
HEMOGLOBIN: 12 g/dL (ref 12.0–16.0)
Lymphocytes Relative: 28 %
Lymphs Abs: 1.6 10*3/uL (ref 1.0–3.6)
MCH: 32.7 pg (ref 26.0–34.0)
MCHC: 32.7 g/dL (ref 32.0–36.0)
MCV: 99.7 fL (ref 80.0–100.0)
MONOS PCT: 8 %
Monocytes Absolute: 0.4 10*3/uL (ref 0.2–0.9)
Neutro Abs: 3.4 10*3/uL (ref 1.4–6.5)
Neutrophils Relative %: 59 %
Platelets: 230 10*3/uL (ref 150–440)
RBC: 3.67 MIL/uL — AB (ref 3.80–5.20)
RDW: 16.7 % — ABNORMAL HIGH (ref 11.5–14.5)
WBC: 5.7 10*3/uL (ref 3.6–11.0)

## 2014-11-10 MED ORDER — PACLITAXEL CHEMO INJECTION 300 MG/50ML
80.0000 mg/m2 | Freq: Once | INTRAVENOUS | Status: AC
  Administered 2014-11-10: 138 mg via INTRAVENOUS
  Filled 2014-11-10: qty 23

## 2014-11-10 MED ORDER — HEPARIN SOD (PORK) LOCK FLUSH 100 UNIT/ML IV SOLN
500.0000 [IU] | Freq: Once | INTRAVENOUS | Status: AC | PRN
  Administered 2014-11-10: 500 [IU]
  Filled 2014-11-10: qty 5

## 2014-11-10 MED ORDER — GABAPENTIN 100 MG PO CAPS: 100.0000 mg | ORAL_CAPSULE | Freq: Every day | ORAL | Status: DC

## 2014-11-10 MED ORDER — DIPHENHYDRAMINE HCL 50 MG/ML IJ SOLN
50.0000 mg | Freq: Once | INTRAMUSCULAR | Status: AC
  Administered 2014-11-10: 50 mg via INTRAVENOUS
  Filled 2014-11-10: qty 1

## 2014-11-10 MED ORDER — FAMOTIDINE IN NACL 20-0.9 MG/50ML-% IV SOLN
20.0000 mg | Freq: Once | INTRAVENOUS | Status: AC
  Administered 2014-11-10: 20 mg via INTRAVENOUS
  Filled 2014-11-10: qty 50

## 2014-11-10 MED ORDER — HYPROMELLOSE (GONIOSCOPIC) 2.5 % OP SOLN: 1.0000 [drp] | Freq: Four times a day (QID) | OPHTHALMIC | Status: DC

## 2014-11-10 MED ORDER — SODIUM CHLORIDE 0.9 % IV SOLN
Freq: Once | INTRAVENOUS | Status: AC
  Administered 2014-11-10: 16:00:00 via INTRAVENOUS
  Filled 2014-11-10: qty 4

## 2014-11-10 MED ORDER — SODIUM CHLORIDE 0.9 % IV SOLN
Freq: Once | INTRAVENOUS | Status: AC
  Administered 2014-11-10: 15:00:00 via INTRAVENOUS
  Filled 2014-11-10: qty 1000

## 2014-11-10 MED ORDER — SODIUM CHLORIDE 0.9 % IJ SOLN
10.0000 mL | INTRAMUSCULAR | Status: DC | PRN
  Administered 2014-11-10: 10 mL
  Filled 2014-11-10: qty 10

## 2014-11-10 NOTE — Progress Notes (Signed)
[place]  Telephone:(336) [phone]  Fax:(336) [phone]     Gina Deleon DOB: [birth year]  MR#: [id number]  LPF#:[id number]  Patient Care Team: No Pcp Per Patient as PCP - General (General Practice) Dallas Schimke, MD (Internal Medicine) Robert Bellow, MD (General Surgery)  CHIEF COMPLAINT:  Chief Complaint  Patient presents with  . Follow-up   Carcinoma of breast. Patient with history of breast cancer, ER positive PR negative HER-2 negative and significant depression, patient attempted suicide in February 2016, also with extensive history of alcohol abuse.  INTERVAL HISTORY:  Patient presents office today for further evaluation and treatment consideration regarding cycle 3 day 1 of Taxol chemotherapy. She has completed 4 of 4 cycles of Cytoxan and Adriamycin. Taxol was initiated on 10/27/2014. She continues to reports mild numbness and tingling in feet. She was previously not started on any medications for this but we had discussed gabapentin, she is now agreeable to starting gabapentin.  REVIEW OF SYSTEMS:   Review of Systems  Constitutional: Negative for fever, chills, weight loss, malaise/fatigue and diaphoresis.  HENT: Negative for congestion, ear discharge, ear pain, hearing loss, nosebleeds, sore throat and tinnitus.   Eyes: Negative for blurred vision, double vision, photophobia, pain, discharge and redness.  Respiratory: Negative for cough, hemoptysis, sputum production, shortness of breath, wheezing and stridor.   Cardiovascular: Negative for chest pain, palpitations, orthopnea, claudication, leg swelling and PND.  Gastrointestinal: Negative for heartburn, nausea, vomiting, abdominal pain, diarrhea, constipation, blood in stool and melena.  Genitourinary: Negative.   Musculoskeletal: Negative.   Skin: Negative.   Neurological: Positive for tingling. Negative for dizziness, focal weakness, seizures, weakness and headaches.  Endo/Heme/Allergies: Does not  bruise/bleed easily.  Psychiatric/Behavioral: Negative for depression. The patient is not nervous/anxious and does not have insomnia.     As per HPI. Otherwise, a complete review of systems is negatve.  ONCOLOGY HISTORY: Oncology History    1. Right breast mass , 5.4 x 4.2 cm lobulated mass on PET/CT February 2016 , also single axillary node seen. No other metastasis present.  Breast mass has been noted over the past 3 years , biopsied in [place] in June 2015. 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 Noted to have high proliferative index , as well as high risk Oncotype score of 46.  Stage IIA, T3 N1 M0.   2.  Patient with significant alcohol abuse , as well as depression with an attempted suicide February 2016.     Breast cancer   07/15/2014 Initial Diagnosis Breast cancer    PAST MEDICAL HISTORY: Past Medical History  Diagnosis Date  . Depression   . Cancer May 2015    right breast  . Breast cancer   . Hypertension   . Hypercholesteremia     PAST SURGICAL HISTORY: Past Surgical History  Procedure Laterality Date  . Abdominal hysterectomy  1981    FAMILY HISTORY Family History  Problem Relation Age of Onset  . Hypertension Mother   . Diabetes Mother     GYNECOLOGIC HISTORY:  No LMP recorded. Patient has had a hysterectomy.     ADVANCED DIRECTIVES:    HEALTH MAINTENANCE: History  Substance Use Topics  . Smoking status: Current Every Day Smoker -- 1.00 packs/day for 20 years    Types: Cigarettes  . Smokeless tobacco: Not on file  . Alcohol Use: 0.0 oz/week    0 Standard drinks  or equivalent per week     Colonoscopy:  PAP:  Bone density:  Lipid panel:  Allergies  Allergen Reactions  . No Known Allergies     Current Outpatient Prescriptions  Medication Sig Dispense Refill  . gabapentin (NEURONTIN) 100 MG capsule Take 1 capsule (100 mg total) by mouth  at bedtime. 30 capsule 2  . lidocaine-prilocaine (EMLA) cream Apply 1 application topically as needed. 30 g 0  . ondansetron (ZOFRAN) 4 MG tablet Take 4 mg by mouth every 8 (eight) hours as needed for nausea or vomiting.    . potassium chloride SA (K-DUR,KLOR-CON) 20 MEQ tablet Take 1 tablet (20 mEq total) by mouth once. Take 20 mEq by mouth once. 30 tablet 5  . traZODone (DESYREL) 100 MG tablet Take 100 mg by mouth as needed for sleep.     No current facility-administered medications for this visit.   Facility-Administered Medications Ordered in Other Visits  Medication Dose Route Frequency Provider Last Rate Last Dose  . sodium chloride 0.9 % injection 10 mL  10 mL Intracatheter PRN Lloyd Huger, MD      . sodium chloride 0.9 % injection 10 mL  10 mL Intracatheter PRN Lloyd Huger, MD   10 mL at 11/10/14 1325    OBJECTIVE: BP 152/101 mmHg  Pulse 93  Temp(Src) 98.1 F (36.7 C) (Tympanic)  Resp 20  Wt 149 lb 0.5 oz (67.6 kg)   Body mass index is 24.09 kg/(m^2).    ECOG FS:1 - Symptomatic but completely ambulatory  General: Well-developed, well-nourished, no acute distress. Eyes: Pink conjunctiva, anicteric sclera. HEENT: Normocephalic, moist mucous membranes, clear oropharnyx. Lungs: Clear to auscultation bilaterally. Heart: Regular rate and rhythm. No rubs, murmurs, or gallops. Abdomen: Soft, nontender, nondistended. No organomegaly noted, normoactive bowel sounds. Musculoskeletal: No edema, cyanosis, or clubbing. Neuro: Alert, answering all questions appropriately. Cranial nerves grossly intact. Grade 1 peripheral neuropathy in BLE. Skin: No rashes or petechiae noted. Psych: Normal affect. Lymphatics: No cervical, calvicular, axillary or inguinal LAD.   LAB RESULTS:     Component Value Date/Time   NA 141 10/27/2014 0906   NA 139 09/15/2014 0830   K 4.0 10/27/2014 0906   K 3.9 09/15/2014 0830   CL 111 10/27/2014 0906   CL 109 09/15/2014 0830   CO2 25  10/27/2014 0906   CO2 27 09/15/2014 0830   GLUCOSE 95 10/27/2014 0906   GLUCOSE 90 09/15/2014 0830   BUN 10 10/27/2014 0906   BUN 7 09/15/2014 0830   CREATININE 0.66 10/27/2014 0906   CREATININE 0.65 09/15/2014 0830   CALCIUM 9.2 10/27/2014 0906   CALCIUM 9.1 09/15/2014 0830   PROT 6.9 10/27/2014 0906   PROT 6.9 09/15/2014 0830   ALBUMIN 3.8 10/27/2014 0906   ALBUMIN 3.7 09/15/2014 0830   AST 13* 10/27/2014 0906   AST 11* 09/15/2014 0830   ALT 6* 10/27/2014 0906   ALT 7* 09/15/2014 0830   ALKPHOS 104 10/27/2014 0906   ALKPHOS 79 09/15/2014 0830   BILITOT 0.3 10/27/2014 0906   GFRNONAA >60 10/27/2014 0906   GFRNONAA >60 09/15/2014 0830   GFRAA >60 10/27/2014 0906   GFRAA >60 09/15/2014 0830    No results found for: SPEP, UPEP  Lab Results  Component Value Date   WBC 4.9 11/17/2014   NEUTROABS 3.3 11/17/2014   HGB 11.8* 11/17/2014   HCT 36.0 11/17/2014   MCV 99.9 11/17/2014   PLT 223 11/17/2014      Chemistry  Component Value Date/Time   NA 141 10/27/2014 0906   NA 139 09/15/2014 0830   K 4.0 10/27/2014 0906   K 3.9 09/15/2014 0830   CL 111 10/27/2014 0906   CL 109 09/15/2014 0830   CO2 25 10/27/2014 0906   CO2 27 09/15/2014 0830   BUN 10 10/27/2014 0906   BUN 7 09/15/2014 0830   CREATININE 0.66 10/27/2014 0906   CREATININE 0.65 09/15/2014 0830      Component Value Date/Time   CALCIUM 9.2 10/27/2014 0906   CALCIUM 9.1 09/15/2014 0830   ALKPHOS 104 10/27/2014 0906   ALKPHOS 79 09/15/2014 0830   AST 13* 10/27/2014 0906   AST 11* 09/15/2014 0830   ALT 6* 10/27/2014 0906   ALT 7* 09/15/2014 0830   BILITOT 0.3 10/27/2014 0906       No results found for: LABCA2  No components found for: [id number]  No results for input(s): INR in the last 168 hours.  No results found for: COLORURINE, APPEARANCEUR, LABSPEC, PHURINE, GLUCOSEU, HGBUR, BILIRUBINUR, KETONESUR, PROTEINUR, UROBILINOGEN, NITRITE, LEUKOCYTESUR  STUDIES: No results found.  ASSESSMENT:     stage IIIa carcinoma of breast , ER positive PR negative HER-2 negative.  PLAN:   1. Breast CA. Patient has completed 4 of 4 cycles of Cytoxan and Adriamycin. She  Was recently started on weekly Taxol 12 weeks. We'll proceed with cycle  3 therapy today as labs are sufficient for treatment. Patient has been instructed that if neuropathy worsens she is to let provider know as side effects of Taxol can be permanent she reports understanding and is agreeable. She will return in one week for cycle  4 of 12 Taxol. In 2 weeks she will return for further evaluation and continuation of Taxol at that time as well.  Patient expressed understanding and was in agreement with this plan. She also understands that She can call clinic at any time with any questions, concerns, or complaints.    Dr. Oliva Bustard was available for consultation and review of plan of care for this patient.  Evlyn Kanner, NP   11/10/14  1:26 PM

## 2014-11-17 ENCOUNTER — Inpatient Hospital Stay (HOSPITAL_BASED_OUTPATIENT_CLINIC_OR_DEPARTMENT_OTHER): Payer: BLUE CROSS/BLUE SHIELD | Admitting: Oncology

## 2014-11-17 ENCOUNTER — Inpatient Hospital Stay: Payer: BLUE CROSS/BLUE SHIELD

## 2014-11-17 VITALS — BP 160/92 | HR 88 | Temp 96.4°F | Resp 20

## 2014-11-17 VITALS — BP 160/92 | HR 105 | Temp 98.9°F | Resp 18 | Wt 148.2 lb

## 2014-11-17 DIAGNOSIS — Z915 Personal history of self-harm: Secondary | ICD-10-CM

## 2014-11-17 DIAGNOSIS — F1010 Alcohol abuse, uncomplicated: Secondary | ICD-10-CM

## 2014-11-17 DIAGNOSIS — Z170 Estrogen receptor positive status [ER+]: Secondary | ICD-10-CM

## 2014-11-17 DIAGNOSIS — E785 Hyperlipidemia, unspecified: Secondary | ICD-10-CM

## 2014-11-17 DIAGNOSIS — C50919 Malignant neoplasm of unspecified site of unspecified female breast: Secondary | ICD-10-CM

## 2014-11-17 DIAGNOSIS — C801 Malignant (primary) neoplasm, unspecified: Secondary | ICD-10-CM

## 2014-11-17 DIAGNOSIS — I10 Essential (primary) hypertension: Secondary | ICD-10-CM

## 2014-11-17 DIAGNOSIS — C50911 Malignant neoplasm of unspecified site of right female breast: Secondary | ICD-10-CM | POA: Diagnosis not present

## 2014-11-17 DIAGNOSIS — F329 Major depressive disorder, single episode, unspecified: Secondary | ICD-10-CM

## 2014-11-17 DIAGNOSIS — F17210 Nicotine dependence, cigarettes, uncomplicated: Secondary | ICD-10-CM

## 2014-11-17 DIAGNOSIS — Z5111 Encounter for antineoplastic chemotherapy: Secondary | ICD-10-CM | POA: Diagnosis not present

## 2014-11-17 DIAGNOSIS — R202 Paresthesia of skin: Secondary | ICD-10-CM

## 2014-11-17 DIAGNOSIS — R200 Anesthesia of skin: Secondary | ICD-10-CM

## 2014-11-17 LAB — CBC WITH DIFFERENTIAL/PLATELET
BASOS ABS: 0 10*3/uL (ref 0–0.1)
Basophils Relative: 0 %
EOS PCT: 3 %
Eosinophils Absolute: 0.1 10*3/uL (ref 0–0.7)
HEMATOCRIT: 36 % (ref 35.0–47.0)
HEMOGLOBIN: 11.8 g/dL — AB (ref 12.0–16.0)
LYMPHS PCT: 24 %
Lymphs Abs: 1.2 10*3/uL (ref 1.0–3.6)
MCH: 32.8 pg (ref 26.0–34.0)
MCHC: 32.8 g/dL (ref 32.0–36.0)
MCV: 99.9 fL (ref 80.0–100.0)
MONOS PCT: 6 %
Monocytes Absolute: 0.3 10*3/uL (ref 0.2–0.9)
Neutro Abs: 3.3 10*3/uL (ref 1.4–6.5)
Neutrophils Relative %: 67 %
PLATELETS: 223 10*3/uL (ref 150–440)
RBC: 3.6 MIL/uL — AB (ref 3.80–5.20)
RDW: 15.9 % — ABNORMAL HIGH (ref 11.5–14.5)
WBC: 4.9 10*3/uL (ref 3.6–11.0)

## 2014-11-17 MED ORDER — FAMOTIDINE IN NACL 20-0.9 MG/50ML-% IV SOLN
20.0000 mg | Freq: Once | INTRAVENOUS | Status: AC
  Administered 2014-11-17: 20 mg via INTRAVENOUS
  Filled 2014-11-17: qty 50

## 2014-11-17 MED ORDER — SODIUM CHLORIDE 0.9 % IV SOLN
Freq: Once | INTRAVENOUS | Status: AC
  Administered 2014-11-17: 11:00:00 via INTRAVENOUS
  Filled 2014-11-17: qty 1000

## 2014-11-17 MED ORDER — HEPARIN SOD (PORK) LOCK FLUSH 100 UNIT/ML IV SOLN
500.0000 [IU] | Freq: Once | INTRAVENOUS | Status: AC | PRN
  Administered 2014-11-17: 500 [IU]

## 2014-11-17 MED ORDER — DEXTROSE 5 % IV SOLN
80.0000 mg/m2 | Freq: Once | INTRAVENOUS | Status: AC
  Administered 2014-11-17: 138 mg via INTRAVENOUS
  Filled 2014-11-17: qty 23

## 2014-11-17 MED ORDER — SODIUM CHLORIDE 0.9 % IV SOLN
Freq: Once | INTRAVENOUS | Status: AC
  Administered 2014-11-17: 12:00:00 via INTRAVENOUS
  Filled 2014-11-17: qty 4

## 2014-11-17 MED ORDER — DIPHENHYDRAMINE HCL 50 MG/ML IJ SOLN
50.0000 mg | Freq: Once | INTRAMUSCULAR | Status: AC
  Administered 2014-11-17: 50 mg via INTRAVENOUS
  Filled 2014-11-17: qty 1

## 2014-11-24 ENCOUNTER — Inpatient Hospital Stay: Payer: BLUE CROSS/BLUE SHIELD

## 2014-11-24 ENCOUNTER — Inpatient Hospital Stay (HOSPITAL_BASED_OUTPATIENT_CLINIC_OR_DEPARTMENT_OTHER): Payer: BLUE CROSS/BLUE SHIELD | Admitting: Oncology

## 2014-11-24 ENCOUNTER — Inpatient Hospital Stay: Payer: BLUE CROSS/BLUE SHIELD | Attending: Oncology

## 2014-11-24 VITALS — BP 156/98 | HR 103 | Temp 96.8°F | Wt 151.0 lb

## 2014-11-24 VITALS — BP 156/96 | HR 106 | Resp 20

## 2014-11-24 DIAGNOSIS — F329 Major depressive disorder, single episode, unspecified: Secondary | ICD-10-CM | POA: Insufficient documentation

## 2014-11-24 DIAGNOSIS — I10 Essential (primary) hypertension: Secondary | ICD-10-CM | POA: Insufficient documentation

## 2014-11-24 DIAGNOSIS — Z170 Estrogen receptor positive status [ER+]: Secondary | ICD-10-CM | POA: Diagnosis not present

## 2014-11-24 DIAGNOSIS — Z90710 Acquired absence of both cervix and uterus: Secondary | ICD-10-CM | POA: Diagnosis not present

## 2014-11-24 DIAGNOSIS — C801 Malignant (primary) neoplasm, unspecified: Secondary | ICD-10-CM

## 2014-11-24 DIAGNOSIS — Z915 Personal history of self-harm: Secondary | ICD-10-CM | POA: Diagnosis not present

## 2014-11-24 DIAGNOSIS — E780 Pure hypercholesterolemia: Secondary | ICD-10-CM | POA: Diagnosis not present

## 2014-11-24 DIAGNOSIS — Z5111 Encounter for antineoplastic chemotherapy: Secondary | ICD-10-CM | POA: Insufficient documentation

## 2014-11-24 DIAGNOSIS — C50911 Malignant neoplasm of unspecified site of right female breast: Secondary | ICD-10-CM | POA: Insufficient documentation

## 2014-11-24 DIAGNOSIS — Z79899 Other long term (current) drug therapy: Secondary | ICD-10-CM | POA: Insufficient documentation

## 2014-11-24 DIAGNOSIS — F1010 Alcohol abuse, uncomplicated: Secondary | ICD-10-CM

## 2014-11-24 DIAGNOSIS — C50919 Malignant neoplasm of unspecified site of unspecified female breast: Secondary | ICD-10-CM

## 2014-11-24 DIAGNOSIS — Z79811 Long term (current) use of aromatase inhibitors: Secondary | ICD-10-CM | POA: Diagnosis not present

## 2014-11-24 DIAGNOSIS — G629 Polyneuropathy, unspecified: Secondary | ICD-10-CM | POA: Insufficient documentation

## 2014-11-24 DIAGNOSIS — F17210 Nicotine dependence, cigarettes, uncomplicated: Secondary | ICD-10-CM

## 2014-11-24 LAB — CBC WITH DIFFERENTIAL/PLATELET
BASOS PCT: 1 %
Basophils Absolute: 0 10*3/uL (ref 0–0.1)
EOS ABS: 0.1 10*3/uL (ref 0–0.7)
EOS PCT: 3 %
HCT: 34.9 % — ABNORMAL LOW (ref 35.0–47.0)
Hemoglobin: 11.3 g/dL — ABNORMAL LOW (ref 12.0–16.0)
LYMPHS ABS: 1.4 10*3/uL (ref 1.0–3.6)
Lymphocytes Relative: 36 %
MCH: 33.1 pg (ref 26.0–34.0)
MCHC: 32.4 g/dL (ref 32.0–36.0)
MCV: 102.1 fL — ABNORMAL HIGH (ref 80.0–100.0)
Monocytes Absolute: 0.4 10*3/uL (ref 0.2–0.9)
Monocytes Relative: 9 %
NEUTROS ABS: 2.1 10*3/uL (ref 1.4–6.5)
Neutrophils Relative %: 51 %
Platelets: 211 10*3/uL (ref 150–440)
RBC: 3.42 MIL/uL — AB (ref 3.80–5.20)
RDW: 15.8 % — ABNORMAL HIGH (ref 11.5–14.5)
WBC: 4 10*3/uL (ref 3.6–11.0)

## 2014-11-24 LAB — COMPREHENSIVE METABOLIC PANEL
ALK PHOS: 82 U/L (ref 38–126)
ALT: 10 U/L — ABNORMAL LOW (ref 14–54)
AST: 18 U/L (ref 15–41)
Albumin: 3.6 g/dL (ref 3.5–5.0)
Anion gap: 8 (ref 5–15)
BILIRUBIN TOTAL: 0.4 mg/dL (ref 0.3–1.2)
BUN: 8 mg/dL (ref 6–20)
CO2: 24 mmol/L (ref 22–32)
CREATININE: 0.77 mg/dL (ref 0.44–1.00)
Calcium: 8.7 mg/dL — ABNORMAL LOW (ref 8.9–10.3)
Chloride: 108 mmol/L (ref 101–111)
GFR calc Af Amer: >60 mL/min (ref >60)
GFR calc non Af Amer: >60 mL/min (ref >60)
GLUCOSE: 124 mg/dL — AB (ref 65–99)
Potassium: 3.7 mmol/L (ref 3.5–5.1)
Sodium: 140 mmol/L (ref 135–145)
Total Protein: 6.4 g/dL — ABNORMAL LOW (ref 6.5–8.1)

## 2014-11-24 MED ORDER — LOTEPREDNOL ETABONATE 0.5 % OP SUSP: 1.0000 [drp] | Freq: Four times a day (QID) | OPHTHALMIC | Status: DC

## 2014-11-24 MED ORDER — PACLITAXEL CHEMO INJECTION 300 MG/50ML
80.0000 mg/m2 | Freq: Once | INTRAVENOUS | Status: AC
  Administered 2014-11-24: 138 mg via INTRAVENOUS
  Filled 2014-11-24: qty 23

## 2014-11-24 MED ORDER — SODIUM CHLORIDE 0.9 % IV SOLN
Freq: Once | INTRAVENOUS | Status: AC
  Administered 2014-11-24: 15:00:00 via INTRAVENOUS
  Filled 2014-11-24: qty 4

## 2014-11-24 MED ORDER — FAMOTIDINE IN NACL 20-0.9 MG/50ML-% IV SOLN
20.0000 mg | Freq: Once | INTRAVENOUS | Status: AC
  Administered 2014-11-24: 20 mg via INTRAVENOUS
  Filled 2014-11-24: qty 50

## 2014-11-24 MED ORDER — HEPARIN SOD (PORK) LOCK FLUSH 100 UNIT/ML IV SOLN
500.0000 [IU] | Freq: Once | INTRAVENOUS | Status: AC | PRN
  Administered 2014-11-24: 500 [IU]
  Filled 2014-11-24: qty 5

## 2014-11-24 MED ORDER — AMLODIPINE BESYLATE 5 MG PO TABS: 5.0000 mg | ORAL_TABLET | Freq: Every day | ORAL | Status: DC

## 2014-11-24 MED ORDER — DIPHENHYDRAMINE HCL 50 MG/ML IJ SOLN
50.0000 mg | Freq: Once | INTRAMUSCULAR | Status: AC
  Administered 2014-11-24: 50 mg via INTRAVENOUS
  Filled 2014-11-24: qty 1

## 2014-11-24 MED ORDER — SODIUM CHLORIDE 0.9 % IV SOLN
Freq: Once | INTRAVENOUS | Status: AC
  Administered 2014-11-24: 15:00:00 via INTRAVENOUS
  Filled 2014-11-24: qty 1000

## 2014-11-24 NOTE — Progress Notes (Signed)
[place]  Telephone:(336) [phone]  Fax:(336) [phone]     Gina Deleon DOB: 1956/01/27  MR#: [id number]  RXV#:[id number]  Patient Care Team: No Pcp Per Patient as PCP - General (General Practice) Dallas Schimke, MD (Internal Medicine) Robert Bellow, MD (General Surgery)  CHIEF COMPLAINT:  Chief Complaint  Patient presents with  . Follow-up    breast cancer    INTERVAL HISTORY:  Patient returns to clinic today for further evaluation and consideration of cycle 4 of weekly Taxol. She has a mild peripheral neuropathy, but it does not affect her day-to-day activity. She otherwise is tolerating her treatments well. She has no other neurologic complaints. She denies any fevers. She has a good appetite and denies weight loss. She denies any nausea, vomiting, constipation, or diarrhea. She has no urinary complaints. Patient otherwise feels well and offers no further specific complaints.  REVIEW OF SYSTEMS:   Review of Systems  Constitutional: Negative.   Respiratory: Negative.   Cardiovascular: Negative.   Neurological: Positive for sensory change.  Psychiatric/Behavioral: Positive for depression.    As per HPI. Otherwise, a complete review of systems is negatve.  ONCOLOGY HISTORY: Oncology History    1. Right breast mass , 5.4 x 4.2 cm lobulated mass on PET/CT February 2016 , also single axillary node seen. No other metastasis present.  Breast mass has been noted over the past 3 years , biopsied in [place] in June 2015. 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 Noted to have high proliferative index , as well as high risk Oncotype score of 46.  Stage IIA, T3 N1 M0.   2.  Patient with significant alcohol abuse , as well as depression with an attempted suicide February 2016.     Breast cancer   07/15/2014 Initial Diagnosis Breast cancer    PAST MEDICAL  HISTORY: Past Medical History  Diagnosis Date  . Depression   . Cancer May 2015    right breast  . Breast cancer   . Hypertension   . Hypercholesteremia     PAST SURGICAL HISTORY: Past Surgical History  Procedure Laterality Date  . Abdominal hysterectomy  1981    FAMILY HISTORY Family History  Problem Relation Age of Onset  . Hypertension Mother   . Diabetes Mother     GYNECOLOGIC HISTORY:  No LMP recorded. Patient has had a hysterectomy.     ADVANCED DIRECTIVES:    HEALTH MAINTENANCE: History  Substance Use Topics  . Smoking status: Current Every Day Smoker -- 1.00 packs/day for 20 years    Types: Cigarettes  . Smokeless tobacco: Not on file  . Alcohol Use: 0.0 oz/week    0 Standard drinks or equivalent per week     Colonoscopy:  PAP:  Bone density:  Lipid panel:  Allergies  Allergen Reactions  . No Known Allergies     Current Outpatient Prescriptions  Medication Sig Dispense Refill  . gabapentin (NEURONTIN) 100 MG capsule Take 1 capsule (100 mg total) by mouth at bedtime. 30 capsule 2  . lidocaine-prilocaine (EMLA) cream Apply 1 application topically as needed. 30 g 0  . ondansetron (ZOFRAN) 4 MG tablet Take 4 mg by mouth every 8 (eight) hours as needed for nausea or vomiting.    . potassium chloride SA (K-DUR,KLOR-CON) 20 MEQ tablet Take 1 tablet (20 mEq total) by mouth once. Take 20 mEq by mouth once. 30 tablet  5  . traZODone (DESYREL) 100 MG tablet Take 100 mg by mouth as needed for sleep.     No current facility-administered medications for this visit.   Facility-Administered Medications Ordered in Other Visits  Medication Dose Route Frequency Provider Last Rate Last Dose  . sodium chloride 0.9 % injection 10 mL  10 mL Intracatheter PRN Lloyd Huger, MD      . sodium chloride 0.9 % injection 10 mL  10 mL Intracatheter PRN Lloyd Huger, MD   10 mL at 11/10/14 1325    OBJECTIVE: BP 160/92 mmHg  Pulse 105  Temp(Src) 98.9 F (37.2 C)  (Tympanic)  Resp 18  Wt 148 lb 2.4 oz (67.2 kg)   Body mass index is 23.95 kg/(m^2).    ECOG FS:1 - Symptomatic but completely ambulatory  General: Well-developed, well-nourished, no acute distress. Eyes: anicteric sclera. Lungs: Clear to auscultation bilaterally. Heart: Regular rate and rhythm. No rubs, murmurs, or gallops. Abdomen: Soft, nontender, nondistended. No organomegaly noted, normoactive bowel sounds. Musculoskeletal: No edema, cyanosis, or clubbing. Neuro: Alert, answering all questions appropriately. Cranial nerves grossly intact.  Skin: No rashes or petechiae noted. Psych: Normal affect.    LAB RESULTS:     Component Value Date/Time   NA 141 10/27/2014 0906   NA 139 09/15/2014 0830   K 4.0 10/27/2014 0906   K 3.9 09/15/2014 0830   CL 111 10/27/2014 0906   CL 109 09/15/2014 0830   CO2 25 10/27/2014 0906   CO2 27 09/15/2014 0830   GLUCOSE 95 10/27/2014 0906   GLUCOSE 90 09/15/2014 0830   BUN 10 10/27/2014 0906   BUN 7 09/15/2014 0830   CREATININE 0.66 10/27/2014 0906   CREATININE 0.65 09/15/2014 0830   CALCIUM 9.2 10/27/2014 0906   CALCIUM 9.1 09/15/2014 0830   PROT 6.9 10/27/2014 0906   PROT 6.9 09/15/2014 0830   ALBUMIN 3.8 10/27/2014 0906   ALBUMIN 3.7 09/15/2014 0830   AST 13* 10/27/2014 0906   AST 11* 09/15/2014 0830   ALT 6* 10/27/2014 0906   ALT 7* 09/15/2014 0830   ALKPHOS 104 10/27/2014 0906   ALKPHOS 79 09/15/2014 0830   BILITOT 0.3 10/27/2014 0906   GFRNONAA >60 10/27/2014 0906   GFRNONAA >60 09/15/2014 0830   GFRAA >60 10/27/2014 0906   GFRAA >60 09/15/2014 0830    No results found for: SPEP, UPEP  Lab Results  Component Value Date   WBC 4.0 11/24/2014   NEUTROABS 2.1 11/24/2014   HGB 11.3* 11/24/2014   HCT 34.9* 11/24/2014   MCV 102.1* 11/24/2014   PLT 211 11/24/2014      Chemistry      Component Value Date/Time   NA 141 10/27/2014 0906   NA 139 09/15/2014 0830   K 4.0 10/27/2014 0906   K 3.9 09/15/2014 0830   CL 111  10/27/2014 0906   CL 109 09/15/2014 0830   CO2 25 10/27/2014 0906   CO2 27 09/15/2014 0830   BUN 10 10/27/2014 0906   BUN 7 09/15/2014 0830   CREATININE 0.66 10/27/2014 0906   CREATININE 0.65 09/15/2014 0830      Component Value Date/Time   CALCIUM 9.2 10/27/2014 0906   CALCIUM 9.1 09/15/2014 0830   ALKPHOS 104 10/27/2014 0906   ALKPHOS 79 09/15/2014 0830   AST 13* 10/27/2014 0906   AST 11* 09/15/2014 0830   ALT 6* 10/27/2014 0906   ALT 7* 09/15/2014 0830   BILITOT 0.3 10/27/2014 1884  No results found for: LABCA2  No components found for: [id number]  No results for input(s): INR in the last 168 hours.  No results found for: COLORURINE, APPEARANCEUR, LABSPEC, PHURINE, GLUCOSEU, HGBUR, BILIRUBINUR, KETONESUR, PROTEINUR, UROBILINOGEN, NITRITE, LEUKOCYTESUR  STUDIES: No results found.  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  PLAN:   1. Breast cancer: Proceed with cycle 4 of 12 of weekly Taxol. Patient previously completed 4 cycles of dose dense Adriamycin and Cytoxan. Return to clinic in 1 week for consideration of cycle 5. Patient will also likely require adjuvant XRT at the completion of her treatments. Given the ER status of her tumor, she will also benefit from an aromatase inhibitor. 2. Peripheral neuropathy: Mild, continue gabapentin as prescribed.   Patient expressed understanding and was in agreement with this plan. She also understands that She can call clinic at any time with any questions, concerns, or complaints.    Lloyd Huger, MD   11/10/14  1:33 PM

## 2014-11-24 NOTE — Progress Notes (Signed)
Patient has been taking Gabapentin for her bilateral leg pain with no relief.  She explains the pain as constant that starts at the hips then radiates all the way down the legs that is worse at night.  Also has itching all over her body and says she can't take the whole dose of Benadryl or the itch will be worse.

## 2014-12-08 ENCOUNTER — Inpatient Hospital Stay: Payer: BLUE CROSS/BLUE SHIELD

## 2014-12-08 ENCOUNTER — Inpatient Hospital Stay (HOSPITAL_BASED_OUTPATIENT_CLINIC_OR_DEPARTMENT_OTHER): Payer: BLUE CROSS/BLUE SHIELD | Admitting: Oncology

## 2014-12-08 VITALS — BP 119/90 | HR 108 | Temp 97.6°F | Wt 148.1 lb

## 2014-12-08 VITALS — BP 141/94 | HR 106 | Resp 20

## 2014-12-08 DIAGNOSIS — C50911 Malignant neoplasm of unspecified site of right female breast: Secondary | ICD-10-CM | POA: Diagnosis not present

## 2014-12-08 DIAGNOSIS — Z5111 Encounter for antineoplastic chemotherapy: Secondary | ICD-10-CM | POA: Diagnosis not present

## 2014-12-08 DIAGNOSIS — F1010 Alcohol abuse, uncomplicated: Secondary | ICD-10-CM

## 2014-12-08 DIAGNOSIS — C801 Malignant (primary) neoplasm, unspecified: Secondary | ICD-10-CM

## 2014-12-08 DIAGNOSIS — E780 Pure hypercholesterolemia: Secondary | ICD-10-CM

## 2014-12-08 DIAGNOSIS — Z79811 Long term (current) use of aromatase inhibitors: Secondary | ICD-10-CM

## 2014-12-08 DIAGNOSIS — F17210 Nicotine dependence, cigarettes, uncomplicated: Secondary | ICD-10-CM

## 2014-12-08 DIAGNOSIS — Z170 Estrogen receptor positive status [ER+]: Secondary | ICD-10-CM | POA: Diagnosis not present

## 2014-12-08 DIAGNOSIS — F329 Major depressive disorder, single episode, unspecified: Secondary | ICD-10-CM

## 2014-12-08 DIAGNOSIS — G629 Polyneuropathy, unspecified: Secondary | ICD-10-CM | POA: Diagnosis not present

## 2014-12-08 DIAGNOSIS — Z915 Personal history of self-harm: Secondary | ICD-10-CM

## 2014-12-08 DIAGNOSIS — C50919 Malignant neoplasm of unspecified site of unspecified female breast: Secondary | ICD-10-CM

## 2014-12-08 DIAGNOSIS — I10 Essential (primary) hypertension: Secondary | ICD-10-CM

## 2014-12-08 DIAGNOSIS — Z90710 Acquired absence of both cervix and uterus: Secondary | ICD-10-CM

## 2014-12-08 DIAGNOSIS — Z79899 Other long term (current) drug therapy: Secondary | ICD-10-CM

## 2014-12-08 LAB — CBC WITH DIFFERENTIAL/PLATELET
Basophils Absolute: 0.1 10*3/uL (ref 0–0.1)
Basophils Relative: 1 %
EOS ABS: 0.1 10*3/uL (ref 0–0.7)
EOS PCT: 3 %
HEMATOCRIT: 39.2 % (ref 35.0–47.0)
Hemoglobin: 13 g/dL (ref 12.0–16.0)
LYMPHS ABS: 2.1 10*3/uL (ref 1.0–3.6)
Lymphocytes Relative: 44 %
MCH: 33.4 pg (ref 26.0–34.0)
MCHC: 33 g/dL (ref 32.0–36.0)
MCV: 101.1 fL — ABNORMAL HIGH (ref 80.0–100.0)
MONOS PCT: 14 %
Monocytes Absolute: 0.7 10*3/uL (ref 0.2–0.9)
NEUTROS ABS: 1.9 10*3/uL (ref 1.4–6.5)
NEUTROS PCT: 38 %
Platelets: 241 10*3/uL (ref 150–440)
RBC: 3.88 MIL/uL (ref 3.80–5.20)
RDW: 14.9 % — ABNORMAL HIGH (ref 11.5–14.5)
WBC: 4.9 10*3/uL (ref 3.6–11.0)

## 2014-12-08 MED ORDER — HEPARIN SOD (PORK) LOCK FLUSH 100 UNIT/ML IV SOLN
500.0000 [IU] | Freq: Once | INTRAVENOUS | Status: AC | PRN
  Filled 2014-12-08: qty 5

## 2014-12-08 MED ORDER — LOTEPREDNOL ETABONATE 0.5 % OP SUSP: 1.0000 [drp] | Freq: Four times a day (QID) | OPHTHALMIC | Status: AC

## 2014-12-08 MED ORDER — AMLODIPINE BESYLATE 5 MG PO TABS: 5.0000 mg | ORAL_TABLET | Freq: Every day | ORAL | Status: DC

## 2014-12-08 MED ORDER — DEXTROSE 5 % IV SOLN
80.0000 mg/m2 | Freq: Once | INTRAVENOUS | Status: AC
  Administered 2014-12-08: 138 mg via INTRAVENOUS
  Filled 2014-12-08: qty 23

## 2014-12-08 MED ORDER — SODIUM CHLORIDE 0.9 % IV SOLN
Freq: Once | INTRAVENOUS | Status: AC
  Administered 2014-12-08: 15:00:00 via INTRAVENOUS
  Filled 2014-12-08: qty 4

## 2014-12-08 MED ORDER — FAMOTIDINE IN NACL 20-0.9 MG/50ML-% IV SOLN
20.0000 mg | Freq: Once | INTRAVENOUS | Status: AC
  Administered 2014-12-08: 20 mg via INTRAVENOUS
  Filled 2014-12-08: qty 50

## 2014-12-08 MED ORDER — POTASSIUM CHLORIDE CRYS ER 20 MEQ PO TBCR: 20.0000 meq | EXTENDED_RELEASE_TABLET | Freq: Once | ORAL | Status: DC

## 2014-12-08 MED ORDER — SODIUM CHLORIDE 0.9 % IJ SOLN
10.0000 mL | INTRAMUSCULAR | Status: DC | PRN
  Filled 2014-12-08: qty 10

## 2014-12-08 MED ORDER — SODIUM CHLORIDE 0.9 % IV SOLN
Freq: Once | INTRAVENOUS | Status: AC
  Administered 2014-12-08: 15:00:00 via INTRAVENOUS
  Filled 2014-12-08: qty 1000

## 2014-12-08 MED ORDER — DIPHENHYDRAMINE HCL 50 MG/ML IJ SOLN
50.0000 mg | Freq: Once | INTRAMUSCULAR | Status: AC
Start: 2014-12-08 — End: 2014-12-08
  Administered 2014-12-08: 50 mg via INTRAVENOUS
  Filled 2014-12-08: qty 1

## 2014-12-08 NOTE — Progress Notes (Signed)
Patient plans on finishing her treatments here then moving to [place] where her family is located.

## 2014-12-09 NOTE — Progress Notes (Signed)
[facility]  Telephone:(336) [phone]  Fax:(336) [phone]     Gina Deleon DOB: 18-Jul-1955  MR#: [id number]  GYK#:[id number]  Patient Care Team: No Pcp Per Patient as PCP - General (General Practice) Dallas Schimke, MD (Internal Medicine) Robert Bellow, MD (General Surgery)  CHIEF COMPLAINT:  Chief Complaint  Patient presents with  . Follow-up    breast cancer    INTERVAL HISTORY:  Patient returns to clinic today for further evaluation and consideration of cycle 5 of weekly Taxol. She has a mild peripheral neuropathy, but it does not affect her day-to-day activity. She otherwise is tolerating her treatments well. She has no other neurologic complaints. She denies any fevers. She has a good appetite and denies weight loss. She denies any nausea, vomiting, constipation, or diarrhea. She has no urinary complaints. Patient otherwise feels well and offers no further specific complaints.  REVIEW OF SYSTEMS:   Review of Systems  Constitutional: Negative.   Respiratory: Negative.   Cardiovascular: Negative.   Neurological: Positive for sensory change.  Psychiatric/Behavioral: Positive for depression.    As per HPI. Otherwise, a complete review of systems is negatve.  ONCOLOGY HISTORY: Oncology History    1. Right breast mass , 5.4 x 4.2 cm lobulated mass on PET/CT February 2016 , also single axillary node seen. No other metastasis present.  Breast mass has been noted over the past 3 years , biopsied in [place] in June 2015. 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 Noted to have high proliferative index , as well as high risk Oncotype score of 46.  Stage IIA, T3 N1 M0.   2.  Patient with significant alcohol abuse , as well as depression with an attempted suicide February 2016.     Breast cancer   07/15/2014 Initial Diagnosis Breast cancer    PAST MEDICAL  HISTORY: Past Medical History  Diagnosis Date  . Depression   . Cancer May 2015    right breast  . Breast cancer   . Hypertension   . Hypercholesteremia     PAST SURGICAL HISTORY: Past Surgical History  Procedure Laterality Date  . Abdominal hysterectomy  1981    FAMILY HISTORY Family History  Problem Relation Age of Onset  . Hypertension Mother   . Diabetes Mother     GYNECOLOGIC HISTORY:  No LMP recorded. Patient has had a hysterectomy.     ADVANCED DIRECTIVES:    HEALTH MAINTENANCE: History  Substance Use Topics  . Smoking status: Current Every Day Smoker -- 1.00 packs/day for 20 years    Types: Cigarettes  . Smokeless tobacco: Not on file  . Alcohol Use: 0.0 oz/week    0 Standard drinks or equivalent per week     Colonoscopy:  PAP:  Bone density:  Lipid panel:  Allergies  Allergen Reactions  . No Known Allergies     Current Outpatient Prescriptions  Medication Sig Dispense Refill  . gabapentin (NEURONTIN) 100 MG capsule Take 1 capsule (100 mg total) by mouth at bedtime. 30 capsule 2  . lidocaine-prilocaine (EMLA) cream Apply 1 application topically as needed. 30 g 0  . ondansetron (ZOFRAN) 4 MG tablet Take 4 mg by mouth every 8 (eight) hours as needed for nausea or vomiting.    . traZODone (DESYREL) 100 MG tablet Take 100 mg by mouth as needed for sleep.    Marland Kitchen amLODipine (NORVASC) 5 MG tablet Take  1 tablet (5 mg total) by mouth daily. 30 tablet 1  . loteprednol (LOTEMAX) 0.5 % ophthalmic suspension Place 1 drop into both eyes 4 (four) times daily. 5 mL 0  . potassium chloride SA (K-DUR,KLOR-CON) 20 MEQ tablet Take 1 tablet (20 mEq total) by mouth once. Take 20 mEq by mouth once. 30 tablet 1   No current facility-administered medications for this visit.   Facility-Administered Medications Ordered in Other Visits  Medication Dose Route Frequency Provider Last Rate Last Dose  . sodium chloride 0.9 % injection 10 mL  10 mL Intracatheter PRN Lloyd Huger, MD      . sodium chloride 0.9 % injection 10 mL  10 mL Intracatheter PRN Lloyd Huger, MD   10 mL at 11/10/14 1325  . sodium chloride 0.9 % injection 10 mL  10 mL Intracatheter PRN Lloyd Huger, MD        OBJECTIVE: BP 156/98 mmHg  Pulse 103  Temp(Src) 96.8 F (36 C) (Tympanic)  Wt 151 lb 0.2 oz (68.499 kg)   Body mass index is 24.41 kg/(m^2).    ECOG FS:1 - Symptomatic but completely ambulatory  General: Well-developed, well-nourished, no acute distress. Eyes: anicteric sclera. Lungs: Clear to auscultation bilaterally. Heart: Regular rate and rhythm. No rubs, murmurs, or gallops. Abdomen: Soft, nontender, nondistended. No organomegaly noted, normoactive bowel sounds. Musculoskeletal: No edema, cyanosis, or clubbing. Neuro: Alert, answering all questions appropriately. Cranial nerves grossly intact.  Skin: No rashes or petechiae noted. Psych: Normal affect.    LAB RESULTS:     Component Value Date/Time   NA 140 11/24/2014 1307   NA 139 09/15/2014 0830   K 3.7 11/24/2014 1307   K 3.9 09/15/2014 0830   CL 108 11/24/2014 1307   CL 109 09/15/2014 0830   CO2 24 11/24/2014 1307   CO2 27 09/15/2014 0830   GLUCOSE 124* 11/24/2014 1307   GLUCOSE 90 09/15/2014 0830   BUN 8 11/24/2014 1307   BUN 7 09/15/2014 0830   CREATININE 0.77 11/24/2014 1307   CREATININE 0.65 09/15/2014 0830   CALCIUM 8.7* 11/24/2014 1307   CALCIUM 9.1 09/15/2014 0830   PROT 6.4* 11/24/2014 1307   PROT 6.9 09/15/2014 0830   ALBUMIN 3.6 11/24/2014 1307   ALBUMIN 3.7 09/15/2014 0830   AST 18 11/24/2014 1307   AST 11* 09/15/2014 0830   ALT 10* 11/24/2014 1307   ALT 7* 09/15/2014 0830   ALKPHOS 82 11/24/2014 1307   ALKPHOS 79 09/15/2014 0830   BILITOT 0.4 11/24/2014 1307   BILITOT 0.4 09/15/2014 0830   GFRNONAA >60 11/24/2014 1307   GFRNONAA >60 09/15/2014 0830   GFRAA >60 11/24/2014 1307   GFRAA >60 09/15/2014 0830    No results found for: SPEP, UPEP  Lab Results   Component Value Date   WBC 4.9 12/08/2014   NEUTROABS 1.9 12/08/2014   HGB 13.0 12/08/2014   HCT 39.2 12/08/2014   MCV 101.1* 12/08/2014   PLT 241 12/08/2014      Chemistry      Component Value Date/Time   NA 140 11/24/2014 1307   NA 139 09/15/2014 0830   K 3.7 11/24/2014 1307   K 3.9 09/15/2014 0830   CL 108 11/24/2014 1307   CL 109 09/15/2014 0830   CO2 24 11/24/2014 1307   CO2 27 09/15/2014 0830   BUN 8 11/24/2014 1307   BUN 7 09/15/2014 0830   CREATININE 0.77 11/24/2014 1307   CREATININE 0.65 09/15/2014 0830  Component Value Date/Time   CALCIUM 8.7* 11/24/2014 1307   CALCIUM 9.1 09/15/2014 0830   ALKPHOS 82 11/24/2014 1307   ALKPHOS 79 09/15/2014 0830   AST 18 11/24/2014 1307   AST 11* 09/15/2014 0830   ALT 10* 11/24/2014 1307   ALT 7* 09/15/2014 0830   BILITOT 0.4 11/24/2014 1307   BILITOT 0.4 09/15/2014 0830       No results found for: LABCA2  No components found for: [id number]  No results for input(s): INR in the last 168 hours.  No results found for: COLORURINE, APPEARANCEUR, LABSPEC, PHURINE, GLUCOSEU, HGBUR, BILIRUBINUR, KETONESUR, PROTEINUR, UROBILINOGEN, NITRITE, LEUKOCYTESUR  STUDIES: No results found.  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  PLAN:   1. Breast cancer: Proceed with cycle 5 of 12 of weekly Taxol. Patient previously completed 4 cycles of dose dense Adriamycin and Cytoxan. Patient has a trip to [place] with her daughter next week, therefore return to clinic in 2 weeks for consideration of cycle 6. Patient will also likely require adjuvant XRT at the completion of her treatments. Given the ER status of her tumor, she will also benefit from an aromatase inhibitor. 2. Peripheral neuropathy: Mild, continue gabapentin as prescribed.   Patient expressed understanding and was in agreement with this plan. She also understands that She can call clinic at any time with any questions,  concerns, or complaints.    Lloyd Huger, MD   11/10/14  9:20 AM

## 2014-12-09 NOTE — Progress Notes (Signed)
[place] [facility]  Telephone:(336) [phone]  Fax:(336) [phone]     Gina Deleon DOB: [birth year]  MR#: [id number]  UXN#:[id number]  Patient Care Team: No Pcp Per Patient as PCP - General (General Practice) Dallas Schimke, MD (Internal Medicine) Robert Bellow, MD (General Surgery)  CHIEF COMPLAINT:  Chief Complaint  Patient presents with  . Follow-up    breast cancer    INTERVAL HISTORY:  Patient returns to clinic today for further evaluation and consideration of cycle 6 of weekly Taxol. She continues to have a mild peripheral neuropathy, but it does not affect her day-to-day activity. She otherwise is tolerating her treatments well. She has no other neurologic complaints. She denies any fevers. She has a good appetite and denies weight loss. She denies any nausea, vomiting, constipation, or diarrhea. She has no urinary complaints. Patient otherwise feels well and offers no further specific complaints.  REVIEW OF SYSTEMS:   Review of Systems  Constitutional: Negative.   Respiratory: Negative.   Cardiovascular: Negative.   Neurological: Positive for sensory change.  Psychiatric/Behavioral: Positive for depression.    As per HPI. Otherwise, a complete review of systems is negatve.  ONCOLOGY HISTORY: Oncology History    1. Right breast mass , 5.4 x 4.2 cm lobulated mass on PET/CT February 2016 , also single axillary node seen. No other metastasis present.  Breast mass has been noted over the past 3 years , biopsied in [place] in June 2015. 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 Noted to have high proliferative index , as well as high risk Oncotype score of 46.  Stage IIA, T3 N1 M0.   2.  Patient with significant alcohol abuse , as well as depression with an attempted suicide February 2016.     Breast cancer   07/15/2014 Initial Diagnosis Breast cancer    PAST  MEDICAL HISTORY: Past Medical History  Diagnosis Date  . Depression   . Cancer May 2015    right breast  . Breast cancer   . Hypertension   . Hypercholesteremia     PAST SURGICAL HISTORY: Past Surgical History  Procedure Laterality Date  . Abdominal hysterectomy  1981    FAMILY HISTORY Family History  Problem Relation Age of Onset  . Hypertension Mother   . Diabetes Mother     GYNECOLOGIC HISTORY:  No LMP recorded. Patient has had a hysterectomy.     ADVANCED DIRECTIVES:    HEALTH MAINTENANCE: History  Substance Use Topics  . Smoking status: Current Every Day Smoker -- 1.00 packs/day for 20 years    Types: Cigarettes  . Smokeless tobacco: Not on file  . Alcohol Use: 0.0 oz/week    0 Standard drinks or equivalent per week     Colonoscopy:  PAP:  Bone density:  Lipid panel:  Allergies  Allergen Reactions  . No Known Allergies     Current Outpatient Prescriptions  Medication Sig Dispense Refill  . amLODipine (NORVASC) 5 MG tablet Take 1 tablet (5 mg total) by mouth daily. 30 tablet 1  . gabapentin (NEURONTIN) 100 MG capsule Take 1 capsule (100 mg total) by mouth at bedtime. 30 capsule 2  . lidocaine-prilocaine (EMLA) cream Apply 1 application topically as needed. 30 g 0  . loteprednol (LOTEMAX) 0.5 % ophthalmic suspension Place 1 drop into both eyes 4 (four) times daily. 5 mL 0  . ondansetron (ZOFRAN) 4 MG tablet Take  4 mg by mouth every 8 (eight) hours as needed for nausea or vomiting.    . potassium chloride SA (K-DUR,KLOR-CON) 20 MEQ tablet Take 1 tablet (20 mEq total) by mouth once. Take 20 mEq by mouth once. 30 tablet 1  . traZODone (DESYREL) 100 MG tablet Take 100 mg by mouth as needed for sleep.     No current facility-administered medications for this visit.   Facility-Administered Medications Ordered in Other Visits  Medication Dose Route Frequency Provider Last Rate Last Dose  . sodium chloride 0.9 % injection 10 mL  10 mL Intracatheter PRN  Lloyd Huger, MD      . sodium chloride 0.9 % injection 10 mL  10 mL Intracatheter PRN Lloyd Huger, MD   10 mL at 11/10/14 1325  . sodium chloride 0.9 % injection 10 mL  10 mL Intracatheter PRN Lloyd Huger, MD        OBJECTIVE: BP 119/90 mmHg  Pulse 108  Temp(Src) 97.6 F (36.4 C) (Tympanic)  Wt 148 lb 2.4 oz (67.2 kg)   Body mass index is 23.95 kg/(m^2).    ECOG FS:1 - Symptomatic but completely ambulatory  General: Well-developed, well-nourished, no acute distress. Eyes: anicteric sclera. Breasts: Exam deferred today. Lungs: Clear to auscultation bilaterally. Heart: Regular rate and rhythm. No rubs, murmurs, or gallops. Abdomen: Soft, nontender, nondistended. No organomegaly noted, normoactive bowel sounds. Musculoskeletal: No edema, cyanosis, or clubbing. Neuro: Alert, answering all questions appropriately. Cranial nerves grossly intact.  Skin: No rashes or petechiae noted. Psych: Normal affect.    LAB RESULTS:     Component Value Date/Time   NA 140 11/24/2014 1307   NA 139 09/15/2014 0830   K 3.7 11/24/2014 1307   K 3.9 09/15/2014 0830   CL 108 11/24/2014 1307   CL 109 09/15/2014 0830   CO2 24 11/24/2014 1307   CO2 27 09/15/2014 0830   GLUCOSE 124* 11/24/2014 1307   GLUCOSE 90 09/15/2014 0830   BUN 8 11/24/2014 1307   BUN 7 09/15/2014 0830   CREATININE 0.77 11/24/2014 1307   CREATININE 0.65 09/15/2014 0830   CALCIUM 8.7* 11/24/2014 1307   CALCIUM 9.1 09/15/2014 0830   PROT 6.4* 11/24/2014 1307   PROT 6.9 09/15/2014 0830   ALBUMIN 3.6 11/24/2014 1307   ALBUMIN 3.7 09/15/2014 0830   AST 18 11/24/2014 1307   AST 11* 09/15/2014 0830   ALT 10* 11/24/2014 1307   ALT 7* 09/15/2014 0830   ALKPHOS 82 11/24/2014 1307   ALKPHOS 79 09/15/2014 0830   BILITOT 0.4 11/24/2014 1307   BILITOT 0.4 09/15/2014 0830   GFRNONAA >60 11/24/2014 1307   GFRNONAA >60 09/15/2014 0830   GFRAA >60 11/24/2014 1307   GFRAA >60 09/15/2014 0830    No results found  for: SPEP, UPEP  Lab Results  Component Value Date   WBC 4.9 12/08/2014   NEUTROABS 1.9 12/08/2014   HGB 13.0 12/08/2014   HCT 39.2 12/08/2014   MCV 101.1* 12/08/2014   PLT 241 12/08/2014      Chemistry      Component Value Date/Time   NA 140 11/24/2014 1307   NA 139 09/15/2014 0830   K 3.7 11/24/2014 1307   K 3.9 09/15/2014 0830   CL 108 11/24/2014 1307   CL 109 09/15/2014 0830   CO2 24 11/24/2014 1307   CO2 27 09/15/2014 0830   BUN 8 11/24/2014 1307   BUN 7 09/15/2014 0830   CREATININE 0.77 11/24/2014 1307   CREATININE  0.65 09/15/2014 0830      Component Value Date/Time   CALCIUM 8.7* 11/24/2014 1307   CALCIUM 9.1 09/15/2014 0830   ALKPHOS 82 11/24/2014 1307   ALKPHOS 79 09/15/2014 0830   AST 18 11/24/2014 1307   AST 11* 09/15/2014 0830   ALT 10* 11/24/2014 1307   ALT 7* 09/15/2014 0830   BILITOT 0.4 11/24/2014 1307   BILITOT 0.4 09/15/2014 0830       No results found for: LABCA2  No components found for: [id number]  No results for input(s): INR in the last 168 hours.  No results found for: COLORURINE, APPEARANCEUR, LABSPEC, PHURINE, GLUCOSEU, HGBUR, BILIRUBINUR, KETONESUR, PROTEINUR, UROBILINOGEN, NITRITE, LEUKOCYTESUR  STUDIES: No results found.  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  PLAN:   1. Breast cancer: Proceed with cycle 6 of 12 of weekly Taxol. Patient previously completed 4 cycles of dose dense Adriamycin and Cytoxan. Return to clinic in 1 week for consideration of cycle 7. Patient will also likely require adjuvant XRT at the completion of her treatments. Given the ER status of her tumor, she will also benefit from an aromatase inhibitor. Patient has stated she will likely complete her treatments in [place], and then move to [place] at the conclusion. 2. Peripheral neuropathy: Mild, continue gabapentin as prescribed.   Patient expressed understanding and was in agreement with this plan.  She also understands that She can call clinic at any time with any questions, concerns, or complaints.    Lloyd Huger, MD   11/10/14  9:22 AM

## 2014-12-15 ENCOUNTER — Inpatient Hospital Stay: Payer: BLUE CROSS/BLUE SHIELD

## 2014-12-15 ENCOUNTER — Encounter: Payer: Self-pay | Admitting: *Deleted

## 2014-12-15 ENCOUNTER — Inpatient Hospital Stay (HOSPITAL_BASED_OUTPATIENT_CLINIC_OR_DEPARTMENT_OTHER): Payer: BLUE CROSS/BLUE SHIELD | Admitting: Oncology

## 2014-12-15 VITALS — BP 143/90 | HR 87 | Temp 96.1°F | Resp 20

## 2014-12-15 DIAGNOSIS — Z79811 Long term (current) use of aromatase inhibitors: Secondary | ICD-10-CM

## 2014-12-15 DIAGNOSIS — Z170 Estrogen receptor positive status [ER+]: Secondary | ICD-10-CM | POA: Diagnosis not present

## 2014-12-15 DIAGNOSIS — I10 Essential (primary) hypertension: Secondary | ICD-10-CM

## 2014-12-15 DIAGNOSIS — Z79899 Other long term (current) drug therapy: Secondary | ICD-10-CM

## 2014-12-15 DIAGNOSIS — F329 Major depressive disorder, single episode, unspecified: Secondary | ICD-10-CM

## 2014-12-15 DIAGNOSIS — C50919 Malignant neoplasm of unspecified site of unspecified female breast: Secondary | ICD-10-CM

## 2014-12-15 DIAGNOSIS — F1010 Alcohol abuse, uncomplicated: Secondary | ICD-10-CM

## 2014-12-15 DIAGNOSIS — G629 Polyneuropathy, unspecified: Secondary | ICD-10-CM

## 2014-12-15 DIAGNOSIS — C50911 Malignant neoplasm of unspecified site of right female breast: Secondary | ICD-10-CM | POA: Diagnosis not present

## 2014-12-15 DIAGNOSIS — Z5111 Encounter for antineoplastic chemotherapy: Secondary | ICD-10-CM | POA: Diagnosis not present

## 2014-12-15 DIAGNOSIS — F17210 Nicotine dependence, cigarettes, uncomplicated: Secondary | ICD-10-CM

## 2014-12-15 DIAGNOSIS — E780 Pure hypercholesterolemia: Secondary | ICD-10-CM

## 2014-12-15 DIAGNOSIS — C801 Malignant (primary) neoplasm, unspecified: Secondary | ICD-10-CM

## 2014-12-15 DIAGNOSIS — Z915 Personal history of self-harm: Secondary | ICD-10-CM

## 2014-12-15 DIAGNOSIS — Z90710 Acquired absence of both cervix and uterus: Secondary | ICD-10-CM

## 2014-12-15 LAB — CBC WITH DIFFERENTIAL/PLATELET
Basophils Absolute: 0.1 10*3/uL (ref 0–0.1)
Basophils Relative: 2 %
EOS ABS: 0.1 10*3/uL (ref 0–0.7)
EOS PCT: 3 %
HEMATOCRIT: 38.6 % (ref 35.0–47.0)
Hemoglobin: 12.7 g/dL (ref 12.0–16.0)
LYMPHS ABS: 2 10*3/uL (ref 1.0–3.6)
LYMPHS PCT: 45 %
MCH: 33.5 pg (ref 26.0–34.0)
MCHC: 32.9 g/dL (ref 32.0–36.0)
MCV: 101.6 fL — AB (ref 80.0–100.0)
MONOS PCT: 7 %
Monocytes Absolute: 0.3 10*3/uL (ref 0.2–0.9)
NEUTROS PCT: 45 %
Neutro Abs: 2 10*3/uL (ref 1.4–6.5)
PLATELETS: 210 10*3/uL (ref 150–440)
RBC: 3.8 MIL/uL (ref 3.80–5.20)
RDW: 14.3 % (ref 11.5–14.5)
WBC: 4.5 10*3/uL (ref 3.6–11.0)

## 2014-12-15 MED ORDER — SODIUM CHLORIDE 0.9 % IJ SOLN
10.0000 mL | INTRAMUSCULAR | Status: DC | PRN
  Administered 2014-12-15: 10 mL
  Filled 2014-12-15: qty 10

## 2014-12-15 MED ORDER — SODIUM CHLORIDE 0.9 % IV SOLN
Freq: Once | INTRAVENOUS | Status: AC
  Administered 2014-12-15: 11:00:00 via INTRAVENOUS
  Filled 2014-12-15: qty 4

## 2014-12-15 MED ORDER — PACLITAXEL CHEMO INJECTION 300 MG/50ML
80.0000 mg/m2 | Freq: Once | INTRAVENOUS | Status: AC
  Administered 2014-12-15: 138 mg via INTRAVENOUS
  Filled 2014-12-15: qty 23

## 2014-12-15 MED ORDER — SODIUM CHLORIDE 0.9 % IV SOLN
Freq: Once | INTRAVENOUS | Status: AC
  Administered 2014-12-15: 10:00:00 via INTRAVENOUS
  Filled 2014-12-15: qty 1000

## 2014-12-15 MED ORDER — FAMOTIDINE IN NACL 20-0.9 MG/50ML-% IV SOLN
20.0000 mg | Freq: Once | INTRAVENOUS | Status: AC
  Administered 2014-12-15: 20 mg via INTRAVENOUS
  Filled 2014-12-15: qty 50

## 2014-12-15 MED ORDER — DIPHENHYDRAMINE HCL 50 MG/ML IJ SOLN
50.0000 mg | Freq: Once | INTRAMUSCULAR | Status: AC
  Administered 2014-12-15: 50 mg via INTRAVENOUS
  Filled 2014-12-15: qty 1

## 2014-12-15 MED ORDER — HEPARIN SOD (PORK) LOCK FLUSH 100 UNIT/ML IV SOLN
500.0000 [IU] | Freq: Once | INTRAVENOUS | Status: AC
  Administered 2014-12-15: 500 [IU] via INTRAVENOUS

## 2014-12-15 MED ORDER — HEPARIN SOD (PORK) LOCK FLUSH 100 UNIT/ML IV SOLN
500.0000 [IU] | Freq: Once | INTRAVENOUS | Status: DC | PRN
  Filled 2014-12-15: qty 5

## 2014-12-15 NOTE — Progress Notes (Unsigned)
Clinical Social Worker met with pt to prior to chemotherapy today to address consult for distress screening. Pt's main stressor is that her disability has been denied and she was also denied working with a lawyer because there was not enough "evidence" of pt's disability. Pt does have medicaid. Pt has a history of depression and a suicide attempt in Feb of 2016 that included a BMU stay. It was during this time that she found out she had cancer and started treatment. Pt does not see a therapist, nor does she take medication. Pt reported that her MD is aware. Pt spoke about her struggles with her children, as well as her supports. Pt plans to move to [place] this fall to live with her daughter and son in law, who are supportive. MD is also aware, per pt. Pt shared that she will continue her treatment.   CSW will see pt during her next chemo treatment, which is 12/22/2014. Transportation is issue for pt as she will be riding the [place]. CSW will continue to follow.   Darden Dates, MSW, LCSW Clinical Social Worker  [phone]

## 2014-12-21 NOTE — Progress Notes (Signed)
[facility]  [phone]  Fax:([phone]     Gina Deleon DOB: 1956-02-28  MR#: [id number]  JSE#:[id number]  Patient Care Team: No Pcp Per Patient as PCP - General ([place]) Dallas Schimke, MD (Internal Medicine) Robert Bellow, MD (General Surgery)  CHIEF COMPLAINT:  No chief complaint on file.   INTERVAL HISTORY:  Patient returns to clinic today for further evaluation and consideration of cycle 7 of weekly Taxol. She continues to have a mild peripheral neuropathy, but it does not affect her day-to-day activity. She otherwise is tolerating her treatments well. She has no other neurologic complaints. She denies any fevers. She has a good appetite and denies weight loss. She denies any nausea, vomiting, constipation, or diarrhea. She has no urinary complaints. Patient offers no further specific complaints.  REVIEW OF SYSTEMS:   Review of Systems  Constitutional: Negative.   Respiratory: Negative.   Cardiovascular: Negative.   Neurological: Positive for sensory change.  Psychiatric/Behavioral: Positive for depression.    As per HPI. Otherwise, a complete review of systems is negatve.  ONCOLOGY HISTORY: Oncology History    1. Right breast mass , 5.4 x 4.2 cm lobulated mass on PET/CT February 2016 , also single axillary node seen. No other metastasis present.  Breast mass has been noted over the past 3 years , biopsied in [place] in June 2015. 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 Noted to have high proliferative index , as well as high risk Oncotype score of 46.  Stage IIA, T3 N1 M0.   2.  Patient with significant alcohol abuse , as well as depression with an attempted suicide February 2016.     Breast cancer   07/15/2014 Initial Diagnosis Breast cancer    PAST MEDICAL HISTORY: Past Medical History  Diagnosis Date  . Depression     . Cancer May 2015    right breast  . Breast cancer   . Hypertension   . Hypercholesteremia     PAST SURGICAL HISTORY: Past Surgical History  Procedure Laterality Date  . Abdominal hysterectomy  1981    FAMILY HISTORY Family History  Problem Relation Age of Onset  . Hypertension Mother   . Diabetes Mother     GYNECOLOGIC HISTORY:  No LMP recorded. Patient has had a hysterectomy.     ADVANCED DIRECTIVES:    HEALTH MAINTENANCE: History  Substance Use Topics  . Smoking status: Current Every Day Smoker -- 1.00 packs/day for 20 years    Types: Cigarettes  . Smokeless tobacco: Not on file  . Alcohol Use: 0.0 oz/week    0 Standard drinks or equivalent per week     Colonoscopy:  PAP:  Bone density:  Lipid panel:  Allergies  Allergen Reactions  . No Known Allergies     Current Outpatient Prescriptions  Medication Sig Dispense Refill  . amLODipine (NORVASC) 5 MG tablet Take 1 tablet (5 mg total) by mouth daily. 30 tablet 1  . gabapentin (NEURONTIN) 100 MG capsule Take 1 capsule (100 mg total) by mouth at bedtime. 30 capsule 2  . lidocaine-prilocaine (EMLA) cream Apply 1 application topically as needed. 30 g 0  . loteprednol (LOTEMAX) 0.5 % ophthalmic suspension Place 1 drop into both eyes 4 (four) times daily. 5 mL 0  . ondansetron (ZOFRAN) 4 MG tablet Take 4 mg by mouth every 8 (eight) hours as needed for nausea or  vomiting.    . potassium chloride SA (K-DUR,KLOR-CON) 20 MEQ tablet Take 1 tablet (20 mEq total) by mouth once. Take 20 mEq by mouth once. 30 tablet 1  . traZODone (DESYREL) 100 MG tablet Take 100 mg by mouth as needed for sleep.     No current facility-administered medications for this visit.   Facility-Administered Medications Ordered in Other Visits  Medication Dose Route Frequency Provider Last Rate Last Dose  . sodium chloride 0.9 % injection 10 mL  10 mL Intracatheter PRN Lloyd Huger, MD      . sodium chloride 0.9 % injection 10 mL  10 mL  Intracatheter PRN Lloyd Huger, MD   10 mL at 11/10/14 1325  . sodium chloride 0.9 % injection 10 mL  10 mL Intracatheter PRN Lloyd Huger, MD        OBJECTIVE: There were no vitals taken for this visit.   There is no weight on file to calculate BMI.    ECOG FS:1 - Symptomatic but completely ambulatory  General: Well-developed, well-nourished, no acute distress. Eyes: anicteric sclera. Breasts: Exam deferred today. Lungs: Clear to auscultation bilaterally. Heart: Regular rate and rhythm. No rubs, murmurs, or gallops. Abdomen: Soft, nontender, nondistended. No organomegaly noted, normoactive bowel sounds. Musculoskeletal: No edema, cyanosis, or clubbing. Neuro: Alert, answering all questions appropriately. Cranial nerves grossly intact.  Skin: No rashes or petechiae noted. Psych: Normal affect.    LAB RESULTS:     Component Value Date/Time   NA 140 11/24/2014 1307   NA 139 09/15/2014 0830   K 3.7 11/24/2014 1307   K 3.9 09/15/2014 0830   CL 108 11/24/2014 1307   CL 109 09/15/2014 0830   CO2 24 11/24/2014 1307   CO2 27 09/15/2014 0830   GLUCOSE 124* 11/24/2014 1307   GLUCOSE 90 09/15/2014 0830   BUN 8 11/24/2014 1307   BUN 7 09/15/2014 0830   CREATININE 0.77 11/24/2014 1307   CREATININE 0.65 09/15/2014 0830   CALCIUM 8.7* 11/24/2014 1307   CALCIUM 9.1 09/15/2014 0830   PROT 6.4* 11/24/2014 1307   PROT 6.9 09/15/2014 0830   ALBUMIN 3.6 11/24/2014 1307   ALBUMIN 3.7 09/15/2014 0830   AST 18 11/24/2014 1307   AST 11* 09/15/2014 0830   ALT 10* 11/24/2014 1307   ALT 7* 09/15/2014 0830   ALKPHOS 82 11/24/2014 1307   ALKPHOS 79 09/15/2014 0830   BILITOT 0.4 11/24/2014 1307   BILITOT 0.4 09/15/2014 0830   GFRNONAA >60 11/24/2014 1307   GFRNONAA >60 09/15/2014 0830   GFRAA >60 11/24/2014 1307   GFRAA >60 09/15/2014 0830    No results found for: SPEP, UPEP  Lab Results  Component Value Date   WBC 4.5 12/15/2014   NEUTROABS 2.0 12/15/2014   HGB 12.7  12/15/2014   HCT 38.6 12/15/2014   MCV 101.6* 12/15/2014   PLT 210 12/15/2014      Chemistry      Component Value Date/Time   NA 140 11/24/2014 1307   NA 139 09/15/2014 0830   K 3.7 11/24/2014 1307   K 3.9 09/15/2014 0830   CL 108 11/24/2014 1307   CL 109 09/15/2014 0830   CO2 24 11/24/2014 1307   CO2 27 09/15/2014 0830   BUN 8 11/24/2014 1307   BUN 7 09/15/2014 0830   CREATININE 0.77 11/24/2014 1307   CREATININE 0.65 09/15/2014 0830      Component Value Date/Time   CALCIUM 8.7* 11/24/2014 1307   CALCIUM 9.1 09/15/2014 0830  ALKPHOS 82 11/24/2014 1307   ALKPHOS 79 09/15/2014 0830   AST 18 11/24/2014 1307   AST 11* 09/15/2014 0830   ALT 10* 11/24/2014 1307   ALT 7* 09/15/2014 0830   BILITOT 0.4 11/24/2014 1307   BILITOT 0.4 09/15/2014 0830       No results found for: LABCA2  No components found for: [id number]  No results for input(s): INR in the last 168 hours.  No results found for: COLORURINE, APPEARANCEUR, LABSPEC, PHURINE, GLUCOSEU, HGBUR, BILIRUBINUR, KETONESUR, PROTEINUR, UROBILINOGEN, NITRITE, LEUKOCYTESUR  STUDIES: No results found.  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  PLAN:   1. Breast cancer: Proceed with cycle 7 of 12 of weekly Taxol. Patient previously completed 4 cycles of dose dense Adriamycin and Cytoxan. Return to clinic in 1 week for consideration of cycle 8. Patient will also likely require adjuvant XRT at the completion of her treatments. Given the ER status of her tumor, she will also benefit from an aromatase inhibitor. Patient has stated she will likely complete her treatments in [place] and then move to [place] at the conclusion. 2. Peripheral neuropathy: Mild, continue gabapentin as prescribed.   Patient expressed understanding and was in agreement with this plan. She also understands that She can call clinic at any time with any questions, concerns, or complaints.    Lloyd Huger, MD   11/10/14  11:13 PM

## 2014-12-22 ENCOUNTER — Inpatient Hospital Stay: Payer: BLUE CROSS/BLUE SHIELD | Attending: Oncology

## 2014-12-22 ENCOUNTER — Inpatient Hospital Stay: Payer: BLUE CROSS/BLUE SHIELD

## 2014-12-22 ENCOUNTER — Encounter: Payer: Self-pay | Admitting: *Deleted

## 2014-12-22 ENCOUNTER — Inpatient Hospital Stay (HOSPITAL_BASED_OUTPATIENT_CLINIC_OR_DEPARTMENT_OTHER): Payer: BLUE CROSS/BLUE SHIELD | Admitting: Oncology

## 2014-12-22 VITALS — BP 130/94 | HR 84 | Temp 97.1°F | Resp 16 | Wt 146.8 lb

## 2014-12-22 DIAGNOSIS — F1010 Alcohol abuse, uncomplicated: Secondary | ICD-10-CM | POA: Insufficient documentation

## 2014-12-22 DIAGNOSIS — C801 Malignant (primary) neoplasm, unspecified: Secondary | ICD-10-CM

## 2014-12-22 DIAGNOSIS — E780 Pure hypercholesterolemia: Secondary | ICD-10-CM

## 2014-12-22 DIAGNOSIS — Z79899 Other long term (current) drug therapy: Secondary | ICD-10-CM

## 2014-12-22 DIAGNOSIS — F17210 Nicotine dependence, cigarettes, uncomplicated: Secondary | ICD-10-CM | POA: Diagnosis not present

## 2014-12-22 DIAGNOSIS — C50919 Malignant neoplasm of unspecified site of unspecified female breast: Secondary | ICD-10-CM

## 2014-12-22 DIAGNOSIS — G629 Polyneuropathy, unspecified: Secondary | ICD-10-CM | POA: Diagnosis not present

## 2014-12-22 DIAGNOSIS — Z170 Estrogen receptor positive status [ER+]: Secondary | ICD-10-CM

## 2014-12-22 DIAGNOSIS — F329 Major depressive disorder, single episode, unspecified: Secondary | ICD-10-CM | POA: Insufficient documentation

## 2014-12-22 DIAGNOSIS — I10 Essential (primary) hypertension: Secondary | ICD-10-CM | POA: Insufficient documentation

## 2014-12-22 DIAGNOSIS — Z915 Personal history of self-harm: Secondary | ICD-10-CM | POA: Insufficient documentation

## 2014-12-22 DIAGNOSIS — C50911 Malignant neoplasm of unspecified site of right female breast: Secondary | ICD-10-CM

## 2014-12-22 LAB — CBC WITH DIFFERENTIAL/PLATELET
Basophils Absolute: 0.1 10*3/uL (ref 0–0.1)
Basophils Relative: 1 %
Eosinophils Absolute: 0.1 10*3/uL (ref 0–0.7)
Eosinophils Relative: 2 %
HCT: 38.7 % (ref 35.0–47.0)
HEMOGLOBIN: 13 g/dL (ref 12.0–16.0)
Lymphocytes Relative: 48 %
Lymphs Abs: 2 10*3/uL (ref 1.0–3.6)
MCH: 33.8 pg (ref 26.0–34.0)
MCHC: 33.7 g/dL (ref 32.0–36.0)
MCV: 100.3 fL — ABNORMAL HIGH (ref 80.0–100.0)
Monocytes Absolute: 0.3 10*3/uL (ref 0.2–0.9)
Monocytes Relative: 6 %
NEUTROS PCT: 43 %
Neutro Abs: 1.9 10*3/uL (ref 1.4–6.5)
PLATELETS: 237 10*3/uL (ref 150–440)
RBC: 3.86 MIL/uL (ref 3.80–5.20)
RDW: 14.5 % (ref 11.5–14.5)
WBC: 4.3 10*3/uL (ref 3.6–11.0)

## 2014-12-22 MED ORDER — FAMOTIDINE IN NACL 20-0.9 MG/50ML-% IV SOLN
20.0000 mg | Freq: Once | INTRAVENOUS | Status: AC
Start: 2014-12-22 — End: 2014-12-22
  Administered 2014-12-22: 20 mg via INTRAVENOUS
  Filled 2014-12-22: qty 50

## 2014-12-22 MED ORDER — SODIUM CHLORIDE 0.9 % IJ SOLN
10.0000 mL | INTRAMUSCULAR | Status: DC | PRN
Start: 2014-12-22 — End: 2014-12-22
  Filled 2014-12-22: qty 10

## 2014-12-22 MED ORDER — DIPHENHYDRAMINE HCL 50 MG/ML IJ SOLN
25.0000 mg | Freq: Once | INTRAMUSCULAR | Status: AC
  Administered 2014-12-22: 25 mg via INTRAVENOUS
  Filled 2014-12-22: qty 1

## 2014-12-22 MED ORDER — DEXTROSE 5 % IV SOLN
72.0000 mg/m2 | Freq: Once | INTRAVENOUS | Status: AC
  Administered 2014-12-22: 126 mg via INTRAVENOUS
  Filled 2014-12-22: qty 21

## 2014-12-22 MED ORDER — HEPARIN SOD (PORK) LOCK FLUSH 100 UNIT/ML IV SOLN
500.0000 [IU] | Freq: Once | INTRAVENOUS | Status: AC
  Administered 2014-12-22: 500 [IU] via INTRAVENOUS
  Filled 2014-12-22: qty 5

## 2014-12-22 MED ORDER — SODIUM CHLORIDE 0.9 % IV SOLN
Freq: Once | INTRAVENOUS | Status: AC
  Administered 2014-12-22: 11:00:00 via INTRAVENOUS
  Filled 2014-12-22: qty 1000

## 2014-12-22 MED ORDER — SODIUM CHLORIDE 0.9 % IV SOLN
Freq: Once | INTRAVENOUS | Status: AC
  Administered 2014-12-22: 12:00:00 via INTRAVENOUS
  Filled 2014-12-22: qty 4

## 2014-12-22 MED ORDER — DIPHENHYDRAMINE HCL 50 MG/ML IJ SOLN
50.0000 mg | Freq: Once | INTRAMUSCULAR | Status: DC
  Filled 2014-12-22: qty 1

## 2014-12-22 NOTE — Progress Notes (Unsigned)
Clinical Social Worker met with pt to provide supportive counseling during treatment. Pt reported that she is doing "fine". Pt with her son, which is going well. Her belongings are in storage, which she has access. However, she has not her from her daughter since she moved to [place]. Pt shared that she plans to move to [place] to live with her other daughter in the next 6 weeks. Pt reports that she is in pain, which the MD feels is from chemo. Pt is unable to sleep due to the pain in her legs. Pt denies any ETOH or drug use and stated that her daughter was the one who reported that she had an ETOH problem. CSW provided supportive counseling around her concerns. Next appt is scheduled during her next chemo, August 10 at 0900.  Darden Dates, MSW, LCSW Clinical Social Worker [phone]

## 2014-12-22 NOTE — Progress Notes (Signed)
Patient still has the feeling of feet have "frost bite" and feels swollen on bottoms of feet the pain gets worse at night

## 2014-12-27 NOTE — Progress Notes (Signed)
[facility]  Telephone:(336) [phone]  Fax:(336) [phone]     Gina Deleon DOB: July 05, 1955  MR#: [id number]  FIE#:[id number]  Patient Care Team: No Pcp Per Patient as PCP - General ([facility]) Dallas Schimke, MD (Internal Medicine) Robert Bellow, MD (General Surgery)  CHIEF COMPLAINT:  Chief Complaint  Patient presents with  . Follow-up    breast cancer    INTERVAL HISTORY:  Patient returns to clinic today for further evaluation and consideration of cycle 9 of weekly Taxol. She continues to have a mild peripheral neuropathy, but it does not affect her day-to-day activity. She otherwise is tolerating her treatments well. She has no other neurologic complaints. She denies any fevers. She has a good appetite and denies weight loss. She denies any nausea, vomiting, constipation, or diarrhea. She has no urinary complaints. Patient offers no further specific complaints.  REVIEW OF SYSTEMS:   Review of Systems  Constitutional: Negative.   Respiratory: Negative.   Cardiovascular: Negative.   Neurological: Positive for sensory change.  Psychiatric/Behavioral: Positive for depression.    As per HPI. Otherwise, a complete review of systems is negatve.  ONCOLOGY HISTORY: Oncology History    1. Right breast mass , 5.4 x 4.2 cm lobulated mass on PET/CT February 2016 , also single axillary node seen. No other metastasis present.  Breast mass has been noted over the past 3 years , biopsied in [place] in June 2015. 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 Noted to have high proliferative index , as well as high risk Oncotype score of 46.  Stage IIA, T3 N1 M0.   2.  Patient with significant alcohol abuse , as well as depression with an attempted suicide February 2016.     Breast cancer   07/15/2014 Initial Diagnosis Breast cancer    PAST MEDICAL HISTORY: Past  Medical History  Diagnosis Date  . Depression   . Cancer May 2015    right breast  . Breast cancer   . Hypertension   . Hypercholesteremia     PAST SURGICAL HISTORY: Past Surgical History  Procedure Laterality Date  . Abdominal hysterectomy  1981    FAMILY HISTORY Family History  Problem Relation Age of Onset  . Hypertension Mother   . Diabetes Mother     GYNECOLOGIC HISTORY:  No LMP recorded. Patient has had a hysterectomy.     ADVANCED DIRECTIVES:    HEALTH MAINTENANCE: History  Substance Use Topics  . Smoking status: Current Every Day Smoker -- 1.00 packs/day for 20 years    Types: Cigarettes  . Smokeless tobacco: Not on file  . Alcohol Use: 0.0 oz/week    0 Standard drinks or equivalent per week     Colonoscopy:  PAP:  Bone density:  Lipid panel:  Allergies  Allergen Reactions  . No Known Allergies     Current Outpatient Prescriptions  Medication Sig Dispense Refill  . amLODipine (NORVASC) 5 MG tablet Take 1 tablet (5 mg total) by mouth daily. 30 tablet 1  . gabapentin (NEURONTIN) 100 MG capsule Take 1 capsule (100 mg total) by mouth at bedtime. 30 capsule 2  . lidocaine-prilocaine (EMLA) cream Apply 1 application topically as needed. 30 g 0  . loteprednol (LOTEMAX) 0.5 % ophthalmic suspension Place 1 drop into both eyes 4 (four) times daily. 5 mL 0  . ondansetron (ZOFRAN) 4 MG tablet Take 4 mg by mouth  every 8 (eight) hours as needed for nausea or vomiting.    . potassium chloride SA (K-DUR,KLOR-CON) 20 MEQ tablet Take 1 tablet (20 mEq total) by mouth once. Take 20 mEq by mouth once. 30 tablet 1  . traZODone (DESYREL) 100 MG tablet Take 100 mg by mouth as needed for sleep.     No current facility-administered medications for this visit.   Facility-Administered Medications Ordered in Other Visits  Medication Dose Route Frequency Provider Last Rate Last Dose  . sodium chloride 0.9 % injection 10 mL  10 mL Intracatheter PRN Lloyd Huger, MD        . sodium chloride 0.9 % injection 10 mL  10 mL Intracatheter PRN Lloyd Huger, MD   10 mL at 11/10/14 1325  . sodium chloride 0.9 % injection 10 mL  10 mL Intracatheter PRN Lloyd Huger, MD        OBJECTIVE: BP 130/94 mmHg  Pulse 84  Temp(Src) 97.1 F (36.2 C) (Tympanic)  Resp 16  Wt 146 lb 13.2 oz (66.599 kg)   Body mass index is 23.74 kg/(m^2).    ECOG FS:1 - Symptomatic but completely ambulatory  General: Well-developed, well-nourished, no acute distress. Eyes: anicteric sclera. Breasts: Exam deferred today. Lungs: Clear to auscultation bilaterally. Heart: Regular rate and rhythm. No rubs, murmurs, or gallops. Abdomen: Soft, nontender, nondistended. No organomegaly noted, normoactive bowel sounds. Musculoskeletal: No edema, cyanosis, or clubbing. Neuro: Alert, answering all questions appropriately. Cranial nerves grossly intact.  Skin: No rashes or petechiae noted. Psych: Normal affect.    LAB RESULTS:     Component Value Date/Time   NA 140 11/24/2014 1307   NA 139 09/15/2014 0830   K 3.7 11/24/2014 1307   K 3.9 09/15/2014 0830   CL 108 11/24/2014 1307   CL 109 09/15/2014 0830   CO2 24 11/24/2014 1307   CO2 27 09/15/2014 0830   GLUCOSE 124* 11/24/2014 1307   GLUCOSE 90 09/15/2014 0830   BUN 8 11/24/2014 1307   BUN 7 09/15/2014 0830   CREATININE 0.77 11/24/2014 1307   CREATININE 0.65 09/15/2014 0830   CALCIUM 8.7* 11/24/2014 1307   CALCIUM 9.1 09/15/2014 0830   PROT 6.4* 11/24/2014 1307   PROT 6.9 09/15/2014 0830   ALBUMIN 3.6 11/24/2014 1307   ALBUMIN 3.7 09/15/2014 0830   AST 18 11/24/2014 1307   AST 11* 09/15/2014 0830   ALT 10* 11/24/2014 1307   ALT 7* 09/15/2014 0830   ALKPHOS 82 11/24/2014 1307   ALKPHOS 79 09/15/2014 0830   BILITOT 0.4 11/24/2014 1307   BILITOT 0.4 09/15/2014 0830   GFRNONAA >60 11/24/2014 1307   GFRNONAA >60 09/15/2014 0830   GFRAA >60 11/24/2014 1307   GFRAA >60 09/15/2014 0830    No results found for: SPEP,  UPEP  Lab Results  Component Value Date   WBC 4.3 12/22/2014   NEUTROABS 1.9 12/22/2014   HGB 13.0 12/22/2014   HCT 38.7 12/22/2014   MCV 100.3* 12/22/2014   PLT 237 12/22/2014      Chemistry      Component Value Date/Time   NA 140 11/24/2014 1307   NA 139 09/15/2014 0830   K 3.7 11/24/2014 1307   K 3.9 09/15/2014 0830   CL 108 11/24/2014 1307   CL 109 09/15/2014 0830   CO2 24 11/24/2014 1307   CO2 27 09/15/2014 0830   BUN 8 11/24/2014 1307   BUN 7 09/15/2014 0830   CREATININE 0.77 11/24/2014 1307   CREATININE  0.65 09/15/2014 0830      Component Value Date/Time   CALCIUM 8.7* 11/24/2014 1307   CALCIUM 9.1 09/15/2014 0830   ALKPHOS 82 11/24/2014 1307   ALKPHOS 79 09/15/2014 0830   AST 18 11/24/2014 1307   AST 11* 09/15/2014 0830   ALT 10* 11/24/2014 1307   ALT 7* 09/15/2014 0830   BILITOT 0.4 11/24/2014 1307   BILITOT 0.4 09/15/2014 0830       No results found for: LABCA2  No components found for: [id number]  No results for input(s): INR in the last 168 hours.  No results found for: COLORURINE, APPEARANCEUR, LABSPEC, PHURINE, GLUCOSEU, HGBUR, BILIRUBINUR, KETONESUR, PROTEINUR, UROBILINOGEN, NITRITE, LEUKOCYTESUR  STUDIES: No results found.  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  PLAN:   1. Breast cancer: Proceed with cycle 9 of 12 of weekly Taxol. Patient previously completed 4 cycles of dose dense Adriamycin and Cytoxan. Return to clinic in 1 week for consideration of cycle 10. Patient will also likely require adjuvant XRT at the completion of her treatments. Given the ER status of her tumor, she will also benefit from an aromatase inhibitor. Patient has stated she will likely complete her treatments in [place] and then move to [place] with her daughter. 2. Peripheral neuropathy: Mild, continue gabapentin as prescribed.  Will dose reduce Taxol 10%.  Patient expressed understanding and was in agreement  with this plan. She also understands that She can call clinic at any time with any questions, concerns, or complaints.    Lloyd Huger, MD   11/10/14  11:04 AM

## 2014-12-29 ENCOUNTER — Inpatient Hospital Stay: Payer: BLUE CROSS/BLUE SHIELD

## 2014-12-29 ENCOUNTER — Inpatient Hospital Stay (HOSPITAL_BASED_OUTPATIENT_CLINIC_OR_DEPARTMENT_OTHER): Payer: BLUE CROSS/BLUE SHIELD | Admitting: Oncology

## 2014-12-29 VITALS — BP 143/90 | HR 88 | Temp 97.7°F | Resp 16 | Wt 148.6 lb

## 2014-12-29 DIAGNOSIS — I10 Essential (primary) hypertension: Secondary | ICD-10-CM

## 2014-12-29 DIAGNOSIS — C50911 Malignant neoplasm of unspecified site of right female breast: Secondary | ICD-10-CM | POA: Diagnosis not present

## 2014-12-29 DIAGNOSIS — Z79899 Other long term (current) drug therapy: Secondary | ICD-10-CM

## 2014-12-29 DIAGNOSIS — C50919 Malignant neoplasm of unspecified site of unspecified female breast: Secondary | ICD-10-CM

## 2014-12-29 DIAGNOSIS — G629 Polyneuropathy, unspecified: Secondary | ICD-10-CM

## 2014-12-29 DIAGNOSIS — C801 Malignant (primary) neoplasm, unspecified: Secondary | ICD-10-CM

## 2014-12-29 DIAGNOSIS — Z915 Personal history of self-harm: Secondary | ICD-10-CM

## 2014-12-29 DIAGNOSIS — E780 Pure hypercholesterolemia: Secondary | ICD-10-CM

## 2014-12-29 DIAGNOSIS — F17210 Nicotine dependence, cigarettes, uncomplicated: Secondary | ICD-10-CM

## 2014-12-29 DIAGNOSIS — Z170 Estrogen receptor positive status [ER+]: Secondary | ICD-10-CM

## 2014-12-29 DIAGNOSIS — F1010 Alcohol abuse, uncomplicated: Secondary | ICD-10-CM

## 2014-12-29 DIAGNOSIS — F329 Major depressive disorder, single episode, unspecified: Secondary | ICD-10-CM

## 2014-12-29 LAB — CBC WITH DIFFERENTIAL/PLATELET
BASOS ABS: 0 10*3/uL (ref 0–0.1)
BASOS PCT: 1 %
Eosinophils Absolute: 0.1 10*3/uL (ref 0–0.7)
Eosinophils Relative: 1 %
HEMATOCRIT: 37.9 % (ref 35.0–47.0)
HEMOGLOBIN: 12.7 g/dL (ref 12.0–16.0)
LYMPHS PCT: 40 %
Lymphs Abs: 1.7 10*3/uL (ref 1.0–3.6)
MCH: 33.7 pg (ref 26.0–34.0)
MCHC: 33.6 g/dL (ref 32.0–36.0)
MCV: 100.3 fL — ABNORMAL HIGH (ref 80.0–100.0)
Monocytes Absolute: 0.3 10*3/uL (ref 0.2–0.9)
Monocytes Relative: 7 %
NEUTROS PCT: 51 %
Neutro Abs: 2.1 10*3/uL (ref 1.4–6.5)
Platelets: 242 10*3/uL (ref 150–440)
RBC: 3.77 MIL/uL — ABNORMAL LOW (ref 3.80–5.20)
RDW: 14.3 % (ref 11.5–14.5)
WBC: 4.1 10*3/uL (ref 3.6–11.0)

## 2014-12-29 MED ORDER — SODIUM CHLORIDE 0.9 % IJ SOLN
10.0000 mL | INTRAMUSCULAR | Status: DC | PRN
  Administered 2014-12-29: 10 mL via INTRAVENOUS
  Filled 2014-12-29: qty 10

## 2014-12-29 MED ORDER — GABAPENTIN 100 MG PO CAPS: 300.0000 mg | ORAL_CAPSULE | Freq: Every day | ORAL | Status: DC

## 2014-12-29 MED ORDER — HEPARIN SOD (PORK) LOCK FLUSH 100 UNIT/ML IV SOLN
500.0000 [IU] | Freq: Once | INTRAVENOUS | Status: AC
  Administered 2014-12-29: 500 [IU] via INTRAVENOUS
  Filled 2014-12-29: qty 5

## 2014-12-29 NOTE — Progress Notes (Signed)
Gina Deleon  [phone]  [phone]     Gina Deleon DOB: 15-Mar-1956  MR#: [id number]  QVZ#:[id number]  Patient Care Team: No Pcp Per Patient as PCP - General (General Practice) Dallas Schimke, MD (Internal Medicine) Robert Bellow, MD (General Surgery)  CHIEF COMPLAINT:  Chief Complaint  Patient presents with  . Follow-up    breast cancer    INTERVAL HISTORY:  Patient returns to clinic today for further evaluation and consideration of cycle 10 of weekly Taxol. Her peripheral neuropathy is much worse making it difficult to sleep. Patient states it does not bother her too much during the day. She otherwise is tolerating her treatments well. She has no other neurologic complaints. She denies any fevers. She has a good appetite and denies weight loss. She denies any nausea, vomiting, constipation, or diarrhea. She has no urinary complaints. Patient offers no further specific complaints.  REVIEW OF SYSTEMS:   Review of Systems  Constitutional: Negative.   Respiratory: Negative.   Cardiovascular: Negative.   Neurological: Positive for sensory change.  Psychiatric/Behavioral: Positive for depression.    As per HPI. Otherwise, a complete review of systems is negatve.  ONCOLOGY HISTORY: Oncology History    1. Right breast mass , 5.4 x 4.2 cm lobulated mass on PET/CT February 2016 , also single axillary node seen. No other metastasis present.  Breast mass has been noted over the past 3 years , biopsied in [place] in June 2015. 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 Noted to have high proliferative index , as well as high risk Oncotype score of 46.  Stage IIA, T3 N1 M0.   2.  Patient with significant alcohol abuse , as well as depression with an attempted suicide February 2016.     Breast cancer   07/15/2014 Initial Diagnosis Breast cancer     PAST MEDICAL HISTORY: Past Medical History  Diagnosis Date  . Depression   . Cancer May 2015    right breast  . Breast cancer   . Hypertension   . Hypercholesteremia     PAST SURGICAL HISTORY: Past Surgical History  Procedure Laterality Date  . Abdominal hysterectomy  1981    FAMILY HISTORY Family History  Problem Relation Age of Onset  . Hypertension Mother   . Diabetes Mother     GYNECOLOGIC HISTORY:  No LMP recorded. Patient has had a hysterectomy.     ADVANCED DIRECTIVES:    HEALTH MAINTENANCE: Social History  Substance Use Topics  . Smoking status: Current Every Day Smoker -- 1.00 packs/day for 20 years    Types: Cigarettes  . Smokeless tobacco: Not on file  . Alcohol Use: 0.0 oz/week    0 Standard drinks or equivalent per week     Colonoscopy:  PAP:  Bone density:  Lipid panel:  Allergies  Allergen Reactions  . No Known Allergies     Current Outpatient Prescriptions  Medication Sig Dispense Refill  . amLODipine (NORVASC) 5 MG tablet Take 1 tablet (5 mg total) by mouth daily. 30 tablet 1  . gabapentin (NEURONTIN) 100 MG capsule Take 3 capsules (300 mg total) by mouth at bedtime. 90 capsule 2  . lidocaine-prilocaine (EMLA) cream Apply 1 application topically as needed. 30 g 0  . loteprednol (LOTEMAX) 0.5 % ophthalmic suspension Place 1 drop into both eyes 4 (four) times daily. 5 mL 0  . ondansetron (ZOFRAN)  4 MG tablet Take 4 mg by mouth every 8 (eight) hours as needed for nausea or vomiting.    . potassium chloride SA (K-DUR,KLOR-CON) 20 MEQ tablet Take 1 tablet (20 mEq total) by mouth once. Take 20 mEq by mouth once. 30 tablet 1  . traZODone (DESYREL) 100 MG tablet Take 100 mg by mouth as needed for sleep.     No current facility-administered medications for this visit.   Facility-Administered Medications Ordered in Other Visits  Medication Dose Route Frequency Provider Last Rate Last Dose  . sodium chloride 0.9 % injection 10 mL  10 mL  Intracatheter PRN Lloyd Huger, MD      . sodium chloride 0.9 % injection 10 mL  10 mL Intracatheter PRN Lloyd Huger, MD   10 mL at 11/10/14 1325  . sodium chloride 0.9 % injection 10 mL  10 mL Intracatheter PRN Lloyd Huger, MD      . sodium chloride 0.9 % injection 10 mL  10 mL Intravenous PRN Lloyd Huger, MD   10 mL at 12/29/14 0921    OBJECTIVE: BP 143/90 mmHg  Pulse 88  Temp(Src) 97.7 F (36.5 C) (Tympanic)  Resp 16  Wt 148 lb 9.4 oz (67.4 kg)   Body mass index is 24.02 kg/(m^2).    ECOG FS:1 - Symptomatic but completely ambulatory  General: Well-developed, well-nourished, no acute distress. Eyes: anicteric sclera. Breasts: Exam deferred today. Lungs: Clear to auscultation bilaterally. Heart: Regular rate and rhythm. No rubs, murmurs, or gallops. Abdomen: Soft, nontender, nondistended. No organomegaly noted, normoactive bowel sounds. Musculoskeletal: No edema, cyanosis, or clubbing. Neuro: Alert, answering all questions appropriately. Cranial nerves grossly intact.  Skin: No rashes or petechiae noted. Psych: Normal affect.    LAB RESULTS:     Component Value Date/Time   NA 140 11/24/2014 1307   NA 139 09/15/2014 0830   K 3.7 11/24/2014 1307   K 3.9 09/15/2014 0830   CL 108 11/24/2014 1307   CL 109 09/15/2014 0830   CO2 24 11/24/2014 1307   CO2 27 09/15/2014 0830   GLUCOSE 124* 11/24/2014 1307   GLUCOSE 90 09/15/2014 0830   BUN 8 11/24/2014 1307   BUN 7 09/15/2014 0830   CREATININE 0.77 11/24/2014 1307   CREATININE 0.65 09/15/2014 0830   CALCIUM 8.7* 11/24/2014 1307   CALCIUM 9.1 09/15/2014 0830   PROT 6.4* 11/24/2014 1307   PROT 6.9 09/15/2014 0830   ALBUMIN 3.6 11/24/2014 1307   ALBUMIN 3.7 09/15/2014 0830   AST 18 11/24/2014 1307   AST 11* 09/15/2014 0830   ALT 10* 11/24/2014 1307   ALT 7* 09/15/2014 0830   ALKPHOS 82 11/24/2014 1307   ALKPHOS 79 09/15/2014 0830   BILITOT 0.4 11/24/2014 1307   BILITOT 0.4 09/15/2014 0830    GFRNONAA >60 11/24/2014 1307   GFRNONAA >60 09/15/2014 0830   GFRAA >60 11/24/2014 1307   GFRAA >60 09/15/2014 0830    No results found for: SPEP, UPEP  Lab Results  Component Value Date   WBC 4.1 12/29/2014   NEUTROABS 2.1 12/29/2014   HGB 12.7 12/29/2014   HCT 37.9 12/29/2014   MCV 100.3* 12/29/2014   PLT 242 12/29/2014      Chemistry      Component Value Date/Time   NA 140 11/24/2014 1307   NA 139 09/15/2014 0830   K 3.7 11/24/2014 1307   K 3.9 09/15/2014 0830   CL 108 11/24/2014 1307   CL 109 09/15/2014 0830  CO2 24 11/24/2014 1307   CO2 27 09/15/2014 0830   BUN 8 11/24/2014 1307   BUN 7 09/15/2014 0830   CREATININE 0.77 11/24/2014 1307   CREATININE 0.65 09/15/2014 0830      Component Value Date/Time   CALCIUM 8.7* 11/24/2014 1307   CALCIUM 9.1 09/15/2014 0830   ALKPHOS 82 11/24/2014 1307   ALKPHOS 79 09/15/2014 0830   AST 18 11/24/2014 1307   AST 11* 09/15/2014 0830   ALT 10* 11/24/2014 1307   ALT 7* 09/15/2014 0830   BILITOT 0.4 11/24/2014 1307   BILITOT 0.4 09/15/2014 0830       No results found for: LABCA2  No components found for: LABCA125  No results for input(s): INR in the last 168 hours.  No results found for: COLORURINE, APPEARANCEUR, LABSPEC, PHURINE, GLUCOSEU, HGBUR, BILIRUBINUR, KETONESUR, PROTEINUR, UROBILINOGEN, NITRITE, LEUKOCYTESUR  STUDIES: No results found.  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  PLAN:   1. Breast cancer: Delay cycle 10 of 12 of weekly Taxol secondary to peripheral neuropathy. Will also consider discontinuing treatment altogether. Patient previously completed 4 cycles of dose dense Adriamycin and Cytoxan. Return to clinic in 1 week for reconsideration of cycle 10. Patient will require surgical referral and the near future if chemotherapy is discontinued. She also likely will require adjuvant XRT at the completion of her treatments. Given the ER status of her tumor,  she will also benefit from an aromatase inhibitor. Patient has stated she will likely complete her treatments in [place] and then move to [place] with her daughter. 2. Peripheral neuropathy: Progressive. Increase gabapentin to 300 mg at night. Hold chemotherapy as above.  Patient expressed understanding and was in agreement with this plan. She also understands that She can call clinic at any time with any questions, concerns, or complaints.    Lloyd Huger, MD   11/10/14  10:11 AM

## 2014-12-29 NOTE — Progress Notes (Signed)
Patient still has neuropathy in her feet that is on the pain scale 10/10 and keeping her awake at night.  Also having fingernail pain.  Only eats 1 meal a day and does not eat any more.

## 2015-01-05 ENCOUNTER — Inpatient Hospital Stay (HOSPITAL_BASED_OUTPATIENT_CLINIC_OR_DEPARTMENT_OTHER): Payer: BLUE CROSS/BLUE SHIELD | Admitting: Oncology

## 2015-01-05 ENCOUNTER — Inpatient Hospital Stay: Payer: BLUE CROSS/BLUE SHIELD

## 2015-01-05 VITALS — BP 144/92 | HR 102 | Temp 98.3°F | Resp 18 | Wt 147.3 lb

## 2015-01-05 DIAGNOSIS — C50919 Malignant neoplasm of unspecified site of unspecified female breast: Secondary | ICD-10-CM

## 2015-01-05 DIAGNOSIS — F17210 Nicotine dependence, cigarettes, uncomplicated: Secondary | ICD-10-CM

## 2015-01-05 DIAGNOSIS — G629 Polyneuropathy, unspecified: Secondary | ICD-10-CM

## 2015-01-05 DIAGNOSIS — F329 Major depressive disorder, single episode, unspecified: Secondary | ICD-10-CM

## 2015-01-05 DIAGNOSIS — C801 Malignant (primary) neoplasm, unspecified: Secondary | ICD-10-CM

## 2015-01-05 DIAGNOSIS — Z915 Personal history of self-harm: Secondary | ICD-10-CM

## 2015-01-05 DIAGNOSIS — Z79899 Other long term (current) drug therapy: Secondary | ICD-10-CM

## 2015-01-05 DIAGNOSIS — Z170 Estrogen receptor positive status [ER+]: Secondary | ICD-10-CM

## 2015-01-05 DIAGNOSIS — F1010 Alcohol abuse, uncomplicated: Secondary | ICD-10-CM

## 2015-01-05 DIAGNOSIS — C50911 Malignant neoplasm of unspecified site of right female breast: Secondary | ICD-10-CM | POA: Diagnosis not present

## 2015-01-05 DIAGNOSIS — I10 Essential (primary) hypertension: Secondary | ICD-10-CM

## 2015-01-05 DIAGNOSIS — E780 Pure hypercholesterolemia: Secondary | ICD-10-CM

## 2015-01-05 LAB — CBC WITH DIFFERENTIAL/PLATELET
Basophils Absolute: 0.1 10*3/uL (ref 0–0.1)
Basophils Relative: 1 %
EOS PCT: 2 %
Eosinophils Absolute: 0.1 10*3/uL (ref 0–0.7)
HCT: 39.3 % (ref 35.0–47.0)
HEMOGLOBIN: 13.4 g/dL (ref 12.0–16.0)
LYMPHS ABS: 2.3 10*3/uL (ref 1.0–3.6)
LYMPHS PCT: 33 %
MCH: 33.5 pg (ref 26.0–34.0)
MCHC: 34 g/dL (ref 32.0–36.0)
MCV: 98.2 fL (ref 80.0–100.0)
Monocytes Absolute: 0.8 10*3/uL (ref 0.2–0.9)
Monocytes Relative: 12 %
Neutro Abs: 3.7 10*3/uL (ref 1.4–6.5)
Neutrophils Relative %: 52 %
Platelets: 263 10*3/uL (ref 150–440)
RBC: 4 MIL/uL (ref 3.80–5.20)
RDW: 14.1 % (ref 11.5–14.5)
WBC: 7 10*3/uL (ref 3.6–11.0)

## 2015-01-05 MED ORDER — HEPARIN SOD (PORK) LOCK FLUSH 100 UNIT/ML IV SOLN
500.0000 [IU] | Freq: Once | INTRAVENOUS | Status: AC | PRN
  Administered 2015-01-05: 500 [IU]
  Filled 2015-01-05: qty 5

## 2015-01-05 MED ORDER — SODIUM CHLORIDE 0.9 % IJ SOLN
10.0000 mL | INTRAMUSCULAR | Status: DC | PRN
  Administered 2015-01-05: 10 mL
  Filled 2015-01-05: qty 10

## 2015-01-05 NOTE — Progress Notes (Signed)
Patient's neuropathy seems to have worsened with the feeling radiating up her legs now.  Also having new muscle pain in her legs.  Appetite is not doing well and reports only eating about 1/2 a meal throughout the day.

## 2015-01-06 ENCOUNTER — Other Ambulatory Visit: Payer: BLUE CROSS/BLUE SHIELD

## 2015-01-06 ENCOUNTER — Ambulatory Visit (INDEPENDENT_AMBULATORY_CARE_PROVIDER_SITE_OTHER): Payer: BLUE CROSS/BLUE SHIELD | Admitting: General Surgery

## 2015-01-06 ENCOUNTER — Encounter: Payer: Self-pay | Admitting: General Surgery

## 2015-01-06 VITALS — BP 106/68 | HR 106 | Resp 16 | Ht 62.0 in | Wt 146.0 lb

## 2015-01-06 DIAGNOSIS — C50911 Malignant neoplasm of unspecified site of right female breast: Secondary | ICD-10-CM

## 2015-01-06 NOTE — Patient Instructions (Addendum)
Left breast mammogram prior to surgery.  The patient is aware to call back for any questions or concerns.

## 2015-01-06 NOTE — Progress Notes (Signed)
Patient ID: Gina Deleon, female DOB: 10/15/1955, 59 y.o. MRN: [id number]  Chief Complaint   Patient presents with   .  Follow-up     right breast cancer    HPI  Gina Deleon is a 59 y.o. female here following up for right breast cancer. She has completed her chemotherapy August 3rd 2016 and is her to discuss surgery options.  She does miss Dr Gitten.  HPI  Past Medical History   Diagnosis  Date   .  Depression    .  Hypertension    .  Hypercholesteremia    .  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    Past Surgical History   Procedure  Laterality  Date   .  Abdominal hysterectomy   1981    Family History   Problem  Relation  Age of Onset   .  Hypertension  Mother    .  Diabetes  Mother     Social History  Social History   Substance Use Topics   .  Smoking status:  Current Every Day Smoker -- 1.00 packs/day for 20 years     Types:  Cigarettes   .  Smokeless tobacco:  None   .  Alcohol Use:  0.0 oz/week     0 Standard drinks or equivalent per week    Allergies   Allergen  Reactions   .  No Known Allergies     Current Outpatient Prescriptions   Medication  Sig  Dispense  Refill   .  amLODipine (NORVASC) 5 MG tablet  Take 1 tablet (5 mg total) by mouth daily.  30 tablet  1   .  gabapentin (NEURONTIN) 100 MG capsule  Take 3 capsules (300 mg total) by mouth at bedtime.  90 capsule  2   .  loteprednol (LOTEMAX) 0.5 % ophthalmic suspension  Place 1 drop into both eyes 4 (four) times daily.  5 mL  0   .  potassium chloride SA (K-DUR,KLOR-CON) 20 MEQ tablet  Take 1 tablet (20 mEq total) by mouth once. Take 20 mEq by mouth once.  30 tablet  1    No current facility-administered medications for this visit.    Facility-Administered Medications Ordered in Other Visits   Medication  Dose  Route  Frequency  Provider  Last Rate  Last Dose   .  sodium chloride 0.9 % injection 10 mL  10 mL   Intracatheter  PRN  Timothy J Finnegan, MD     .  sodium chloride 0.9 % injection 10 mL  10 mL  Intracatheter  PRN  Timothy J Finnegan, MD   10 mL at 11/10/14 1325   .  sodium chloride 0.9 % injection 10 mL  10 mL  Intracatheter  PRN  Timothy J Finnegan, MD     .  sodium chloride 0.9 % injection 10 mL  10 mL  Intracatheter  PRN  Timothy J Finnegan, MD   10 mL at 01/05/15 0911    Review of Systems  Review of Systems  Constitutional: Negative.  Respiratory: Negative.  Cardiovascular: Negative.   Blood pressure 106/68, pulse 106, resp. rate 16, height 5' 2" (1.575 m), weight 146 lb (66.225 kg).  Physical Exam  Physical Exam  Constitutional: She is oriented to person, place, and time. She appears well-developed and well-nourished.  HENT:  Mouth/Throat: Oropharynx is clear and moist.  Eyes:   Conjunctivae are normal. No scleral icterus.  Neck: Neck supple.  Cardiovascular: Normal rate, regular rhythm and normal heart sounds.  Pulmonary/Chest: Effort normal and breath sounds normal. Right breast exhibits no inverted nipple, no mass, no nipple discharge, no skin change and no tenderness. Left breast exhibits no inverted nipple, no mass, no nipple discharge, no skin change and no tenderness.    Thickening upper inner quadrant right breast.  Lymphadenopathy:  She has no cervical adenopathy.  She has no axillary adenopathy.  Neurological: She is alert and oriented to person, place, and time.  Skin: Skin is warm and dry.  Psychiatric: Her behavior is normal.   Data Reviewed  DIAGNOSIS:  A. RIGHT BREAST, CORE BIOPSY ([id number], OBTAINED 09/29/2013):  - INVASIVE MAMMARY CARCINOMA WITH FEATURES OF PLEOMORPHIC LOBULAR  CARCINOMA.   B. LYMPH NODE, RIGHT AXILLA, FINE-NEEDLE ASPIRATE ([id number], OBTAINED  09/29/2013):  - NEGATIVE FOR MALIGNANT CELLS; LYMPHOID TISSUE PRESENT.   Comment:  Please note that these specimens were obtained in 2015.   In part A, the cores show carcinoma with grade 3  features  (tubular/glandular differentiation score 3, nuclear pleomorphism score  2, mitotic rate score 3, total score 8). Occasional signet ring cells  are noted. By immunohistochemistry (IHC) the neoplastic cells are  positive for cytokeratin and estrogen receptor. By report the cells are  negative for e-cadherin, which supports the above diagnosis. Other  reported IHC results: Negative for CK7, CK20, CK5/6, S100. LCA stains  background lymphocytes. Ki-67 shows nuclear positivity in a high  proportion of cells.  Biomarker results:  ER: Positive, >90%, strong staining  PR: Negative by report  HER2: Negative by report   Ultrasound examination of the right breast was undertaken to confirm visualization of the previous biopsy site. In the 1:00 position, 4 cm from the nipple the right breast is an irregular hypoechoic mass with posterior acoustic enhancement with a central clip placement noted. This area measures 1.2 x 1.35 x 1.6 cm.  In the right axilla 2 lymph nodes are identified the largest measuring 1 cm in diameter. Loss of normal echo architecture is appreciated. These are at the lower limit of the axilla. BI-RADS-6.  Assessment   Excellent clinical response to neo-adjuvant chemotherapy.   Plan   Options for management now that she has completed her adjuvant chemotherapy (last 2 cycles of Taxol deferred secondary to progressive neuropathy) were reviewed. Breast conservation and mastectomy were presented as equivalent procedures. The possibility that wide excision to negative margins could produce and on acceptable breast contour deformity was reviewed. The patient is amenable to mastectomy if clinically indicated. The role of sentinel node biopsy and possibility of axillary dissection was discussed.  The patient desires breast conservation if possible.  Arrangements will be made for a left breast screening mammogram prior to surgery.  Of note, [place]. She was lost to follow-up until spring 2016.   Schedule surgery.  Patient's surgery has been scheduled for 01-20-15 at [facility].  Left breast screening mammogram prior to surgery. Patient to stop by the [facility] and sign a release for the facility to obtain prior breast films from [place]. Mammogram to be arranged once they obtains films per Jamie at [facility].  The patient is aware to call back for any questions or concerns.  Dr Finnegan  Anissa Abbs W  01/08/2015, 7:07 AM  

## 2015-01-07 NOTE — Progress Notes (Signed)
[facility]  [phone]  Fax:([phone]     Gina Deleon DOB: Apr 14, 1956  MR#: [id number]  GBT#:[id number]  Patient Care Team: No Pcp Per Patient as PCP - General (General Practice) Dallas Schimke, MD (Internal Medicine) Robert Bellow, MD (General Surgery)  CHIEF COMPLAINT:  Chief Complaint  Patient presents with  . Follow-up    breast cancer    INTERVAL HISTORY:  Patient returns to clinic today for further evaluation and reconsideration of cycle 10 of weekly Taxol. Her peripheral neuropathy is unchanged. She otherwise is tolerating her treatments well. She has no other neurologic complaints. She denies any fevers. She has a good appetite and denies weight loss. She denies any nausea, vomiting, constipation, or diarrhea. She has no urinary complaints. Patient offers no further specific complaints.  REVIEW OF SYSTEMS:   Review of Systems  Constitutional: Negative.   Respiratory: Negative.   Cardiovascular: Negative.   Neurological: Positive for sensory change.  Psychiatric/Behavioral: Positive for depression.    As per HPI. Otherwise, a complete review of systems is negatve.  ONCOLOGY HISTORY: Oncology History    1. Right breast mass , 5.4 x 4.2 cm lobulated mass on PET/CT February 2016 , also single axillary node seen. No other metastasis present.  Breast mass has been noted over the past 3 years , biopsied in [place] in June 2015. 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 Noted to have high proliferative index , as well as high risk Oncotype score of 46.  Stage IIA, T3 N1 M0.   2.  Patient with significant alcohol abuse , as well as depression with an attempted suicide February 2016.     Breast cancer   07/15/2014 Initial Diagnosis Breast cancer    PAST MEDICAL HISTORY: Past Medical History  Diagnosis Date  . Depression   .  Hypertension   . Hypercholesteremia   . Breast cancer May 2015    right breast    PAST SURGICAL HISTORY: Past Surgical History  Procedure Laterality Date  . Abdominal hysterectomy  1981    FAMILY HISTORY Family History  Problem Relation Age of Onset  . Hypertension Mother   . Diabetes Mother     GYNECOLOGIC HISTORY:  No LMP recorded. Patient has had a hysterectomy.     ADVANCED DIRECTIVES:    HEALTH MAINTENANCE: Social History  Substance Use Topics  . Smoking status: Current Every Day Smoker -- 1.00 packs/day for 20 years    Types: Cigarettes  . Smokeless tobacco: Not on file  . Alcohol Use: 0.0 oz/week    0 Standard drinks or equivalent per week     Colonoscopy:  PAP:  Bone density:  Lipid panel:  Allergies  Allergen Reactions  . No Known Allergies     Current Outpatient Prescriptions  Medication Sig Dispense Refill  . amLODipine (NORVASC) 5 MG tablet Take 1 tablet (5 mg total) by mouth daily. 30 tablet 1  . gabapentin (NEURONTIN) 100 MG capsule Take 3 capsules (300 mg total) by mouth at bedtime. 90 capsule 2  . loteprednol (LOTEMAX) 0.5 % ophthalmic suspension Place 1 drop into both eyes 4 (four) times daily. 5 mL 0  . potassium chloride SA (K-DUR,KLOR-CON) 20 MEQ tablet Take 1 tablet (20 mEq total) by mouth once. Take 20 mEq by mouth once. 30 tablet 1   No current facility-administered medications for this visit.   Facility-Administered Medications  Ordered in Other Visits  Medication Dose Route Frequency Provider Last Rate Last Dose  . sodium chloride 0.9 % injection 10 mL  10 mL Intracatheter PRN Lloyd Huger, MD      . sodium chloride 0.9 % injection 10 mL  10 mL Intracatheter PRN Lloyd Huger, MD   10 mL at 11/10/14 1325  . sodium chloride 0.9 % injection 10 mL  10 mL Intracatheter PRN Lloyd Huger, MD      . sodium chloride 0.9 % injection 10 mL  10 mL Intracatheter PRN Lloyd Huger, MD   10 mL at 01/05/15 0911     OBJECTIVE: BP 144/92 mmHg  Pulse 102  Temp(Src) 98.3 F (36.8 C) (Tympanic)  Resp 18  Wt 147 lb 4.3 oz (66.801 kg)   Body mass index is 23.81 kg/(m^2).    ECOG FS:1 - Symptomatic but completely ambulatory  General: Well-developed, well-nourished, no acute distress. Eyes: anicteric sclera. Breasts: Exam deferred today. Lungs: Clear to auscultation bilaterally. Heart: Regular rate and rhythm. No rubs, murmurs, or gallops. Abdomen: Soft, nontender, nondistended. No organomegaly noted, normoactive bowel sounds. Musculoskeletal: No edema, cyanosis, or clubbing. Neuro: Alert, answering all questions appropriately. Cranial nerves grossly intact.  Skin: No rashes or petechiae noted. Psych: Normal affect.    LAB RESULTS:     Component Value Date/Time   NA 140 11/24/2014 1307   NA 139 09/15/2014 0830   K 3.7 11/24/2014 1307   K 3.9 09/15/2014 0830   CL 108 11/24/2014 1307   CL 109 09/15/2014 0830   CO2 24 11/24/2014 1307   CO2 27 09/15/2014 0830   GLUCOSE 124* 11/24/2014 1307   GLUCOSE 90 09/15/2014 0830   BUN 8 11/24/2014 1307   BUN 7 09/15/2014 0830   CREATININE 0.77 11/24/2014 1307   CREATININE 0.65 09/15/2014 0830   CALCIUM 8.7* 11/24/2014 1307   CALCIUM 9.1 09/15/2014 0830   PROT 6.4* 11/24/2014 1307   PROT 6.9 09/15/2014 0830   ALBUMIN 3.6 11/24/2014 1307   ALBUMIN 3.7 09/15/2014 0830   AST 18 11/24/2014 1307   AST 11* 09/15/2014 0830   ALT 10* 11/24/2014 1307   ALT 7* 09/15/2014 0830   ALKPHOS 82 11/24/2014 1307   ALKPHOS 79 09/15/2014 0830   BILITOT 0.4 11/24/2014 1307   BILITOT 0.4 09/15/2014 0830   GFRNONAA >60 11/24/2014 1307   GFRNONAA >60 09/15/2014 0830   GFRAA >60 11/24/2014 1307   GFRAA >60 09/15/2014 0830    No results found for: SPEP, UPEP  Lab Results  Component Value Date   WBC 7.0 01/05/2015   NEUTROABS 3.7 01/05/2015   HGB 13.4 01/05/2015   HCT 39.3 01/05/2015   MCV 98.2 01/05/2015   PLT 263 01/05/2015      Chemistry       Component Value Date/Time   NA 140 11/24/2014 1307   NA 139 09/15/2014 0830   K 3.7 11/24/2014 1307   K 3.9 09/15/2014 0830   CL 108 11/24/2014 1307   CL 109 09/15/2014 0830   CO2 24 11/24/2014 1307   CO2 27 09/15/2014 0830   BUN 8 11/24/2014 1307   BUN 7 09/15/2014 0830   CREATININE 0.77 11/24/2014 1307   CREATININE 0.65 09/15/2014 0830      Component Value Date/Time   CALCIUM 8.7* 11/24/2014 1307   CALCIUM 9.1 09/15/2014 0830   ALKPHOS 82 11/24/2014 1307   ALKPHOS 79 09/15/2014 0830   AST 18 11/24/2014 1307   AST 11* 09/15/2014  0830   ALT 10* 11/24/2014 1307   ALT 7* 09/15/2014 0830   BILITOT 0.4 11/24/2014 1307   BILITOT 0.4 09/15/2014 0830       No results found for: LABCA2  No components found for: HTVGV025  No results for input(s): INR in the last 168 hours.  No results found for: COLORURINE, APPEARANCEUR, LABSPEC, PHURINE, GLUCOSEU, HGBUR, BILIRUBINUR, KETONESUR, PROTEINUR, UROBILINOGEN, NITRITE, LEUKOCYTESUR  STUDIES: No results found.  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  PLAN:   1. Breast cancer: Will discontinue Taxol altogether secondary to peripheral neuropathy. Patient completed a total of 9 treatments. Patient previously completed 4 cycles of dose dense Adriamycin and Cytoxan. Return to clinic in 6 weeks for further evaluation. A surgical referral has been made, but patient indicated she may pursue surgery and continuation of treatment in [place]. She also likely will require adjuvant XRT at the completion of her treatments. Given the ER status of her tumor, she will also benefit from an aromatase inhibitor. Patient has stated she will likely complete her treatments in [place] and then move to [place] with her daughter. 2. Peripheral neuropathy: Progressive. Increase gabapentin to 300 mg at night. Discontinue chemotherapy as above.   Patient expressed understanding and was in agreement with  this plan. She also understands that She can call clinic at any time with any questions, concerns, or complaints.    Lloyd Huger, MD   11/10/14  3:36 PM

## 2015-01-08 ENCOUNTER — Encounter: Payer: Self-pay | Admitting: General Surgery

## 2015-01-08 NOTE — H&P (Signed)
Patient ID: Gina Deleon, female DOB: 1955/11/17, 59 y.o. MRN: [id number]  Chief Complaint   Patient presents with   .  Follow-up     right breast cancer    HPI  Gina Deleon is a 59 y.o. female here following up for right breast cancer. She has completed her chemotherapy August 3rd 2016 and is her to discuss surgery options.  She does miss Dr Cynda Acres.  HPI  Past Medical History   Diagnosis  Date   .  Depression    .  Hypertension    .  Hypercholesteremia    .  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    Past Surgical History   Procedure  Laterality  Date   .  Abdominal hysterectomy   1981    Family History   Problem  Relation  Age of Onset   .  Hypertension  Mother    .  Diabetes  Mother     Social History  Social History   Substance Use Topics   .  Smoking status:  Current Every Day Smoker -- 1.00 packs/day for 20 years     Types:  Cigarettes   .  Smokeless tobacco:  None   .  Alcohol Use:  0.0 oz/week     0 Standard drinks or equivalent per week    Allergies   Allergen  Reactions   .  No Known Allergies     Current Outpatient Prescriptions   Medication  Sig  Dispense  Refill   .  amLODipine (NORVASC) 5 MG tablet  Take 1 tablet (5 mg total) by mouth daily.  30 tablet  1   .  gabapentin (NEURONTIN) 100 MG capsule  Take 3 capsules (300 mg total) by mouth at bedtime.  90 capsule  2   .  loteprednol (LOTEMAX) 0.5 % ophthalmic suspension  Place 1 drop into both eyes 4 (four) times daily.  5 mL  0   .  potassium chloride SA (K-DUR,KLOR-CON) 20 MEQ tablet  Take 1 tablet (20 mEq total) by mouth once. Take 20 mEq by mouth once.  30 tablet  1    No current facility-administered medications for this visit.    Facility-Administered Medications Ordered in Other Visits   Medication  Dose  Route  Frequency  Provider  Last Rate  Last Dose   .  sodium chloride 0.9 % injection 10 mL  10 mL   Intracatheter  PRN  Lloyd Huger, MD     .  sodium chloride 0.9 % injection 10 mL  10 mL  Intracatheter  PRN  Lloyd Huger, MD   10 mL at 11/10/14 1325   .  sodium chloride 0.9 % injection 10 mL  10 mL  Intracatheter  PRN  Lloyd Huger, MD     .  sodium chloride 0.9 % injection 10 mL  10 mL  Intracatheter  PRN  Lloyd Huger, MD   10 mL at 01/05/15 0911    Review of Systems  Review of Systems  Constitutional: Negative.  Respiratory: Negative.  Cardiovascular: Negative.   Blood pressure 106/68, pulse 106, resp. rate 16, height _0  (1.575 m), weight 146 lb (66.225 kg).  Physical Exam  Physical Exam  Constitutional: She is oriented to person, place, and time. She appears well-developed and well-nourished.  HENT:  Mouth/Throat: Oropharynx is clear and moist.  Eyes:  Conjunctivae are normal. No scleral icterus.  Neck: Neck supple.  Cardiovascular: Normal rate, regular rhythm and normal heart sounds.  Pulmonary/Chest: Effort normal and breath sounds normal. Right breast exhibits no inverted nipple, no mass, no nipple discharge, no skin change and no tenderness. Left breast exhibits no inverted nipple, no mass, no nipple discharge, no skin change and no tenderness.    Thickening upper inner quadrant right breast.  Lymphadenopathy:  She has no cervical adenopathy.  She has no axillary adenopathy.  Neurological: She is alert and oriented to person, place, and time.  Skin: Skin is warm and dry.  Psychiatric: Her behavior is normal.   Data Reviewed  DIAGNOSIS:  A. RIGHT BREAST, CORE BIOPSY [phone], OBTAINED 09/29/2013):  - INVASIVE MAMMARY CARCINOMA WITH FEATURES OF PLEOMORPHIC LOBULAR  CARCINOMA.   B. LYMPH NODE, RIGHT AXILLA, FINE-NEEDLE ASPIRATE ([id number], OBTAINED  09/29/2013):  - NEGATIVE FOR MALIGNANT CELLS; LYMPHOID TISSUE PRESENT.   Comment:  Please note that these specimens were obtained in 2015.   In part A, the cores show carcinoma with grade 3  features  (tubular/glandular differentiation score 3, nuclear pleomorphism score  2, mitotic rate score 3, total score 8). Occasional signet ring cells  are noted. By immunohistochemistry (IHC) the neoplastic cells are  positive for cytokeratin and estrogen receptor. By report the cells are  negative for e-cadherin, which supports the above diagnosis. Other  reported IHC results: Negative for CK7, CK20, CK5/6, S100. LCA stains  background lymphocytes. Ki-67 shows nuclear positivity in a high  proportion of cells.  Biomarker results:  ER: Positive, >90%, strong staining  PR: Negative by report  HER2: Negative by report   Ultrasound examination of the right breast was undertaken to confirm visualization of the previous biopsy site. In the 1:00 position, 4 cm from the nipple the right breast is an irregular hypoechoic mass with posterior acoustic enhancement with a central clip placement noted. This area measures 1.2 x 1.35 x 1.6 cm.  In the right axilla 2 lymph nodes are identified the largest measuring 1 cm in diameter. Loss of normal echo architecture is appreciated. These are at the lower limit of the axilla. BI-RADS-6.  Assessment   Excellent clinical response to neo-adjuvant chemotherapy.   Plan   Options for management now that she has completed her adjuvant chemotherapy (last 2 cycles of Taxol deferred secondary to progressive neuropathy) were reviewed. Breast conservation and mastectomy were presented as equivalent procedures. The possibility that wide excision to negative margins could produce and on acceptable breast contour deformity was reviewed. The patient is amenable to mastectomy if clinically indicated. The role of sentinel node biopsy and possibility of axillary dissection was discussed.  The patient desires breast conservation if possible.  Arrangements will be made for a left breast screening mammogram prior to surgery.  Of note, the [place]. She was lost to follow-up until spring 2016.   Schedule surgery.  Patient's surgery has been scheduled for 01-20-15 at [facility].  Left breast screening mammogram prior to surgery. Patient to stop by the [facility] and sign a release for the facility to obtain prior breast films from [place]. Mammogram to be arranged once they obtains films per Roselyn Reef at [place].  The patient is aware to call back for any questions or concerns.  Dr Tonny Bollman, [place] Gleason  01/08/2015, 7:07 AM

## 2015-01-13 ENCOUNTER — Inpatient Hospital Stay: Admission: RE | Admit: 2015-01-13 | Payer: BLUE CROSS/BLUE SHIELD | Source: Ambulatory Visit

## 2015-01-13 ENCOUNTER — Encounter: Payer: Self-pay | Admitting: *Deleted

## 2015-01-13 NOTE — Patient Instructions (Signed)
  Your procedure is scheduled on: 01-20-15 Report to [place] @ 7:45.   Remember: Instructions that are not followed completely may result in serious medical risk, up to and including death, or upon the discretion of your surgeon and anesthesiologist your surgery may need to be rescheduled.    _X___ 1. Do not eat food or drink liquids after midnight. No gum chewing or hard candies.     _X___ 2. No Alcohol for 24 hours before or after surgery.   ____ 3. Bring all medications with you on the day of surgery if instructed.    ____ 4. Notify your doctor if there is any change in your medical condition     (cold, fever, infections).     Do not wear jewelry, make-up, hairpins, clips or nail polish.  Do not wear lotions, powders, or perfumes. You may wear deodorant.  Do not shave 48 hours prior to surgery. Men may shave face and neck.  Do not bring valuables to the hospital.    [facility] is not responsible for any belongings or valuables.               Contacts, dentures or bridgework may not be worn into surgery.  Leave your suitcase in the car. After surgery it may be brought to your room.  For patients admitted to the hospital, discharge time is determined by your treatment team.   Patients discharged the day of surgery will not be allowed to drive home.   Please read over the following fact sheets that you were given:      _X___ Take these medicines the morning of surgery with A SIP OF WATER:    1. AMLODIPINE  2.   3.   4.  5.  6.  ____ Fleet Enema (as directed)   ____ Use CHG Soap as directed  ____ Use inhalers on the day of surgery  ____ Stop metformin 2 days prior to surgery    ____ Take 1/2 of usual insulin dose the night before surgery and none on the morning of surgery.   __X__ Stop Coumadin/Plavix/aspirin-STOP GOODYS POWDER NOW  ____ Stop Anti-inflammatories-NO NSAIDS OR ASA PRODUCTS-TYLENOL OK   ____ Stop supplements until  after surgery.    ____ Bring C-Pap to the hospital.

## 2015-01-17 ENCOUNTER — Other Ambulatory Visit: Payer: Self-pay | Admitting: *Deleted

## 2015-01-17 DIAGNOSIS — C50911 Malignant neoplasm of unspecified site of right female breast: Secondary | ICD-10-CM

## 2015-01-18 ENCOUNTER — Ambulatory Visit
Admission: RE | Admit: 2015-01-18 | Discharge: 2015-01-18 | Disposition: A | Discharge status: Home / Self Care | Payer: BLUE CROSS/BLUE SHIELD | Source: Ambulatory Visit | Attending: General Surgery | Admitting: General Surgery

## 2015-01-18 ENCOUNTER — Other Ambulatory Visit: Payer: Self-pay | Admitting: General Surgery

## 2015-01-18 DIAGNOSIS — C50911 Malignant neoplasm of unspecified site of right female breast: Secondary | ICD-10-CM

## 2015-01-18 DIAGNOSIS — Z9221 Personal history of antineoplastic chemotherapy: Secondary | ICD-10-CM | POA: Insufficient documentation

## 2015-01-18 HISTORY — DX: Personal history of antineoplastic chemotherapy: Z92.21

## 2015-01-19 ENCOUNTER — Encounter: Payer: Self-pay | Admitting: *Deleted

## 2015-01-20 ENCOUNTER — Ambulatory Visit
Admission: RE | Admit: 2015-01-20 | Discharge: 2015-01-20 | Disposition: A | Discharge status: Home / Self Care | Payer: BLUE CROSS/BLUE SHIELD | Source: Ambulatory Visit | Attending: General Surgery | Admitting: General Surgery

## 2015-01-20 ENCOUNTER — Ambulatory Visit: Payer: BLUE CROSS/BLUE SHIELD | Admitting: Anesthesiology

## 2015-01-20 ENCOUNTER — Encounter
Admission: RE | Disposition: A | Discharge status: Home / Self Care | Payer: Self-pay | Source: Ambulatory Visit | Attending: General Surgery

## 2015-01-20 ENCOUNTER — Ambulatory Visit: Payer: BLUE CROSS/BLUE SHIELD

## 2015-01-20 ENCOUNTER — Encounter: Payer: Self-pay | Admitting: *Deleted

## 2015-01-20 DIAGNOSIS — I10 Essential (primary) hypertension: Secondary | ICD-10-CM | POA: Diagnosis not present

## 2015-01-20 DIAGNOSIS — C50911 Malignant neoplasm of unspecified site of right female breast: Secondary | ICD-10-CM | POA: Insufficient documentation

## 2015-01-20 DIAGNOSIS — F17210 Nicotine dependence, cigarettes, uncomplicated: Secondary | ICD-10-CM | POA: Diagnosis not present

## 2015-01-20 DIAGNOSIS — D649 Anemia, unspecified: Secondary | ICD-10-CM | POA: Insufficient documentation

## 2015-01-20 DIAGNOSIS — F329 Major depressive disorder, single episode, unspecified: Secondary | ICD-10-CM | POA: Diagnosis not present

## 2015-01-20 HISTORY — PX: BREAST LUMPECTOMY WITH SENTINEL LYMPH NODE BIOPSY: SHX5597

## 2015-01-20 HISTORY — DX: Polyneuropathy, unspecified: G62.9

## 2015-01-20 HISTORY — DX: Anemia, unspecified: D64.9

## 2015-01-20 SURGERY — BREAST LUMPECTOMY WITH SENTINEL LYMPH NODE BX
Anesthesia: General | Laterality: Right | Wound class: Clean

## 2015-01-20 MED ORDER — PROPOFOL 10 MG/ML IV BOLUS
INTRAVENOUS | Status: DC | PRN
  Administered 2015-01-20: 140 mg via INTRAVENOUS

## 2015-01-20 MED ORDER — FENTANYL CITRATE (PF) 100 MCG/2ML IJ SOLN
INTRAMUSCULAR | Status: AC
  Filled 2015-01-20: qty 2

## 2015-01-20 MED ORDER — ONDANSETRON HCL 4 MG/2ML IJ SOLN
INTRAMUSCULAR | Status: DC | PRN
  Administered 2015-01-20: 4 mg via INTRAVENOUS

## 2015-01-20 MED ORDER — FENTANYL CITRATE (PF) 100 MCG/2ML IJ SOLN
INTRAMUSCULAR | Status: DC | PRN
  Administered 2015-01-20 (×2): 25 ug via INTRAVENOUS
  Administered 2015-01-20: 50 ug via INTRAVENOUS

## 2015-01-20 MED ORDER — PHENYLEPHRINE HCL 10 MG/ML IJ SOLN
INTRAMUSCULAR | Status: DC | PRN
  Administered 2015-01-20 (×4): 100 ug via INTRAVENOUS

## 2015-01-20 MED ORDER — KETOROLAC TROMETHAMINE 30 MG/ML IJ SOLN
INTRAMUSCULAR | Status: DC | PRN
  Administered 2015-01-20: 30 mg via INTRAVENOUS

## 2015-01-20 MED ORDER — METHYLENE BLUE 1 % INJ SOLN
INTRAMUSCULAR | Status: DC | PRN
  Administered 2015-01-20: 1.5 mL via SUBMUCOSAL

## 2015-01-20 MED ORDER — METHYLENE BLUE 1 % INJ SOLN
INTRAMUSCULAR | Status: AC
  Filled 2015-01-20: qty 10

## 2015-01-20 MED ORDER — BUPIVACAINE-EPINEPHRINE (PF) 0.5% -1:200000 IJ SOLN
INTRAMUSCULAR | Status: DC | PRN
  Administered 2015-01-20: 30 mL

## 2015-01-20 MED ORDER — FENTANYL CITRATE (PF) 100 MCG/2ML IJ SOLN
INTRAMUSCULAR | Status: AC
  Administered 2015-01-20: 25 ug via INTRAVENOUS
  Filled 2015-01-20: qty 2

## 2015-01-20 MED ORDER — ONDANSETRON HCL 4 MG/2ML IJ SOLN
4.0000 mg | Freq: Once | INTRAMUSCULAR | Status: DC | PRN
Start: 2015-01-20 — End: 2015-01-20

## 2015-01-20 MED ORDER — FAMOTIDINE 20 MG PO TABS
20.0000 mg | ORAL_TABLET | Freq: Once | ORAL | Status: AC
  Administered 2015-01-20: 20 mg via ORAL

## 2015-01-20 MED ORDER — DEXAMETHASONE SODIUM PHOSPHATE 4 MG/ML IJ SOLN
INTRAMUSCULAR | Status: DC | PRN
  Administered 2015-01-20: 10 mg via INTRAVENOUS

## 2015-01-20 MED ORDER — GLYCOPYRROLATE 0.2 MG/ML IJ SOLN
INTRAMUSCULAR | Status: DC | PRN
  Administered 2015-01-20: .2 mg via INTRAVENOUS

## 2015-01-20 MED ORDER — ACETAMINOPHEN 10 MG/ML IV SOLN
INTRAVENOUS | Status: DC | PRN
  Administered 2015-01-20: 1000 mg via INTRAVENOUS

## 2015-01-20 MED ORDER — LIDOCAINE HCL (CARDIAC) 20 MG/ML IV SOLN
INTRAVENOUS | Status: DC | PRN
  Administered 2015-01-20: 80 mg via INTRAVENOUS

## 2015-01-20 MED ORDER — LACTATED RINGERS IV SOLN
INTRAVENOUS | Status: DC
  Administered 2015-01-20: 09:00:00 via INTRAVENOUS

## 2015-01-20 MED ORDER — HYDROCODONE-ACETAMINOPHEN 5-325 MG PO TABS: 1.0000 | ORAL_TABLET | ORAL | Status: DC | PRN

## 2015-01-20 MED ORDER — FAMOTIDINE 20 MG PO TABS
ORAL_TABLET | ORAL | Status: AC
  Filled 2015-01-20: qty 1

## 2015-01-20 MED ORDER — SODIUM CHLORIDE 0.9 % IJ SOLN
INTRAMUSCULAR | Status: AC
  Filled 2015-01-20: qty 10

## 2015-01-20 MED ORDER — MIDAZOLAM HCL 2 MG/2ML IJ SOLN
INTRAMUSCULAR | Status: DC | PRN
  Administered 2015-01-20: 2 mg via INTRAVENOUS

## 2015-01-20 MED ORDER — HYDROCODONE-ACETAMINOPHEN 5-325 MG PO TABS: 1.0000 | ORAL_TABLET | Freq: Four times a day (QID) | ORAL | Status: DC | PRN

## 2015-01-20 MED ORDER — TECHNETIUM TC 99M SULFUR COLLOID
1.1100 | Freq: Once | INTRAVENOUS | Status: DC | PRN
  Administered 2015-01-20: 1.11 via INTRAVENOUS
  Filled 2015-01-20: qty 1.11

## 2015-01-20 MED ORDER — BUPIVACAINE-EPINEPHRINE (PF) 0.5% -1:200000 IJ SOLN
INTRAMUSCULAR | Status: AC
  Filled 2015-01-20: qty 30

## 2015-01-20 MED ORDER — ACETAMINOPHEN 10 MG/ML IV SOLN
INTRAVENOUS | Status: AC
  Filled 2015-01-20: qty 100

## 2015-01-20 MED ORDER — FENTANYL CITRATE (PF) 100 MCG/2ML IJ SOLN
25.0000 ug | INTRAMUSCULAR | Status: AC | PRN
Start: 2015-01-20 — End: 2015-01-20
  Administered 2015-01-20 (×6): 25 ug via INTRAVENOUS

## 2015-01-20 SURGICAL SUPPLY — 51 items
BANDAGE ELASTIC 6 CLIP NS LF (GAUZE/BANDAGES/DRESSINGS) ×3 IMPLANT
BENZOIN TINCTURE PRP APPL 2/3 (GAUZE/BANDAGES/DRESSINGS) ×3 IMPLANT
BLADE SURG 15 STRL SS SAFETY (BLADE) ×3 IMPLANT
BNDG GAUZE 4.5X4.1 6PLY STRL (MISCELLANEOUS) ×3 IMPLANT
BULB RESERV EVAC DRAIN JP 100C (MISCELLANEOUS) IMPLANT
CANISTER SUCT 1200ML W/VALVE (MISCELLANEOUS) ×3 IMPLANT
CHLORAPREP W/TINT 26ML (MISCELLANEOUS) ×3 IMPLANT
CLOSURE WOUND 1/2 X4 (GAUZE/BANDAGES/DRESSINGS) ×1
CNTNR SPEC 2.5X3XGRAD LEK (MISCELLANEOUS) ×2
CONT SPEC 4OZ STER OR WHT (MISCELLANEOUS) ×4
CONTAINER SPEC 2.5X3XGRAD LEK (MISCELLANEOUS) ×2 IMPLANT
COVER PROBE FLX POLY STRL (MISCELLANEOUS) ×3 IMPLANT
DEVICE DUBIN SPECIMEN MAMMOGRA (MISCELLANEOUS) ×3 IMPLANT
DRAIN CHANNEL JP 15F RND 16 (MISCELLANEOUS) IMPLANT
DRAPE LAPAROTOMY TRNSV 106X77 (MISCELLANEOUS) ×3 IMPLANT
DRSG TELFA 3X8 NADH (GAUZE/BANDAGES/DRESSINGS) ×3 IMPLANT
ELECT CAUTERY BLADE TIP 2.5 (TIP) ×3
ELECTRODE CAUTERY BLDE TIP 2.5 (TIP) ×1 IMPLANT
GAUZE FLUFF 18X24 1PLY STRL (GAUZE/BANDAGES/DRESSINGS) ×3 IMPLANT
GAUZE SPONGE 4X4 12PLY STRL (GAUZE/BANDAGES/DRESSINGS) IMPLANT
GLOVE BIO SURGEON STRL SZ7.5 (GLOVE) ×6 IMPLANT
GLOVE INDICATOR 8.0 STRL GRN (GLOVE) ×6 IMPLANT
GOWN STRL REUS W/ TWL LRG LVL3 (GOWN DISPOSABLE) ×2 IMPLANT
GOWN STRL REUS W/TWL LRG LVL3 (GOWN DISPOSABLE) ×4
HARMONIC SCALPEL FOCUS (MISCELLANEOUS) IMPLANT
KIT RM TURNOVER STRD PROC AR (KITS) ×3 IMPLANT
LABEL OR SOLS (LABEL) IMPLANT
MARGIN MAP 10MM (MISCELLANEOUS) ×3 IMPLANT
NDL SAFETY 22GX1.5 (NEEDLE) ×3 IMPLANT
NEEDLE HYPO 25X1 1.5 SAFETY (NEEDLE) ×6 IMPLANT
PACK BASIN MINOR ARMC (MISCELLANEOUS) ×3 IMPLANT
PAD GROUND ADULT SPLIT (MISCELLANEOUS) ×3 IMPLANT
SHEARS FOC LG CVD HARMONIC 17C (MISCELLANEOUS) IMPLANT
SLEVE PROBE SENORX GAMMA FIND (MISCELLANEOUS) ×3 IMPLANT
STRIP CLOSURE SKIN 1/2X4 (GAUZE/BANDAGES/DRESSINGS) ×2 IMPLANT
SUT ETHILON 3-0 FS-10 30 BLK (SUTURE) ×3
SUT SILK 2 0 (SUTURE) ×2
SUT SILK 2-0 18XBRD TIE 12 (SUTURE) ×1 IMPLANT
SUT VIC AB 2-0 CT1 27 (SUTURE) ×4
SUT VIC AB 2-0 CT1 TAPERPNT 27 (SUTURE) ×2 IMPLANT
SUT VIC AB 3-0 SH 27 (SUTURE) ×2
SUT VIC AB 3-0 SH 27X BRD (SUTURE) ×1 IMPLANT
SUT VIC AB 4-0 FS2 27 (SUTURE) ×3 IMPLANT
SUT VICRYL+ 3-0 144IN (SUTURE) ×3 IMPLANT
SUTURE EHLN 3-0 FS-10 30 BLK (SUTURE) ×1 IMPLANT
SWABSTK COMLB BENZOIN TINCTURE (MISCELLANEOUS) ×3 IMPLANT
SYR BULB IRRIG 60ML STRL (SYRINGE) ×3 IMPLANT
SYR CONTROL 10ML (SYRINGE) ×3 IMPLANT
SYRINGE 10CC LL (SYRINGE) ×3 IMPLANT
TAPE TRANSPORE STRL 2 31045 (GAUZE/BANDAGES/DRESSINGS) ×3 IMPLANT
WATER STERILE IRR 1000ML POUR (IV SOLUTION) ×3 IMPLANT

## 2015-01-20 NOTE — Anesthesia Postprocedure Evaluation (Signed)
  Anesthesia Post-op Note  Patient: Gina Deleon  Procedure(s) Performed: Procedure(s): Right breast wide excision with sentinel lymph node biopsy  (Right)  Anesthesia type:General  Patient location: PACU  Post pain: Pain level controlled  Post assessment: Post-op Vital signs reviewed, Patient's Cardiovascular Status Stable, Respiratory Function Stable, Patent Airway and No signs of Nausea or vomiting  Post vital signs: Reviewed and stable  Last Vitals:  Filed Vitals:   01/20/15 1246  BP: 106/76  Pulse: 83  Temp:   Resp: 16    Level of consciousness: awake, alert  and patient cooperative  Complications: No apparent anesthesia complications

## 2015-01-20 NOTE — Anesthesia Preprocedure Evaluation (Signed)
Anesthesia Evaluation  Patient identified by MRN, date of birth, ID band Patient awake    Reviewed: Allergy & Precautions, NPO status , Patient's Chart, lab work & pertinent test results, reviewed documented beta blocker date and time   Airway Mallampati: II  TM Distance: >3 FB     Dental  (+) Chipped   Pulmonary Current Smoker,          Cardiovascular hypertension, Pt. on medications     Neuro/Psych PSYCHIATRIC DISORDERS Depression    GI/Hepatic   Endo/Other    Renal/GU      Musculoskeletal   Abdominal   Peds  Hematology  (+) anemia ,   Anesthesia Other Findings   Reproductive/Obstetrics                             Anesthesia Physical Anesthesia Plan  ASA: II  Anesthesia Plan: General   Post-op Pain Management:    Induction: Intravenous  Airway Management Planned: LMA  Additional Equipment:   Intra-op Plan:   Post-operative Plan:   Informed Consent: I have reviewed the patients History and Physical, chart, labs and discussed the procedure including the risks, benefits and alternatives for the proposed anesthesia with the patient or authorized representative who has indicated his/her understanding and acceptance.     Plan Discussed with: CRNA  Anesthesia Plan Comments:         Anesthesia Quick Evaluation

## 2015-01-20 NOTE — Op Note (Signed)
Preoperative diagnosis: Right breast cancer status post neobladder and chemotherapy.  Postoperative diagnosis: Same.  Operative procedure: Left breast wide excision with ultrasound guidance, mastoplasty, sentinel node biopsy.  Operative surgeon: Ollen Bowl, M.D.  Anesthesia: Gen. via LMA, Marcaine 0.5% with 1-200,000 epinephrine, 30 mL local infiltration.  Estimated blood loss 10 mL.  Clinical note: This 59 year old was identified with a right breast mass and underwent new adjuvant chemotherapy with excellent results. She thought the operative this time for planned wide excision and sentinel node biopsy.  Operative note with the patient under adequate general anesthesia 4 mL of normal saline/methylene blue was injected in the subareolar plexus. She previously been injected with technetium sulfur colloid. The breast was prepped with floor prep and draped as was the axilla and neck. Ultrasound was used to confirm location of the previous biopsy site in the upper inner quadrant of the right breast. The edges of tissue change noted on ultrasound were marked and a 4 x 4 by 5 cm block of tissue was resected beginning 1 cm below the skin surface. The specimen was orientated and specimen radiograph confirmed the clip in the center of the tissue submitted. Gross examination by the pathology department showed fibrosis at the skin and inferior margins but no gross evidence of tumor.  Attention was turned to the axilla. Local counts were noted, lower than 30. A transverse incision was made at the base of the axillary hairline. Skin was signed sharply and dissection  continued to the axillary envelope. One blue non-hot node and one blue hot node were identified. Touch preps were reported as negative for metastatic disease.  The axillary wound was closed with 2-0 Vicryls figure-of-eight sutures followed by running 4-0 Vicryls septic suture to the skin.  Mastoplasty was completed by elevating the breast  off the underlying pectoralis muscle in its near entirety. The fascia was then approximated with interrupted 2-0 Vicryls sutures to reconstruct the breast volume. The adipose layer was approximated with interrupted 2-0 Vicryls sutures. The deep dermal layer was approximated with interrupted 2-0 Vicryls simple sutures. The skin was approximated with a running 4-0 Vicryls septic suture. Benzoin, Steri-Strips and Telfa pad applied. Fluff gauze, Kerlix and Ace wrap was placed.  The patient tolerated the procedure well was taken to recovery in stable condition.

## 2015-01-20 NOTE — Anesthesia Procedure Notes (Signed)
Procedure Name: LMA Insertion Date/Time: 01/20/2015 9:33 AM Performed by: Silvana Newness Pre-anesthesia Checklist: Patient identified, Emergency Drugs available, Suction available, Patient being monitored and Timeout performed Patient Re-evaluated:Patient Re-evaluated prior to inductionOxygen Delivery Method: Circle system utilized Preoxygenation: Pre-oxygenation with 100% oxygen Intubation Type: IV induction Ventilation: Mask ventilation without difficulty LMA: LMA inserted LMA Size: 3.5 Number of attempts: 1 Placement Confirmation: positive ETCO2 and breath sounds checked- equal and bilateral Tube secured with: Tape Dental Injury: Teeth and Oropharynx as per pre-operative assessment

## 2015-01-20 NOTE — Transfer of Care (Signed)
Immediate Anesthesia Transfer of Care Note  Patient: Gina Deleon  Procedure(s) Performed: Procedure(s): Right breast wide excision with sentinel lymph node biopsy  (Right)  Patient Location: PACU  Anesthesia Type:General  Level of Consciousness: awake, alert , oriented and patient cooperative  Airway & Oxygen Therapy: Patient Spontanous Breathing and Patient connected to face mask oxygen  Post-op Assessment: Report given to RN, Post -op Vital signs reviewed and stable and Patient moving all extremities X 4  Post vital signs: Reviewed and stable  Last Vitals:  Filed Vitals:   01/20/15 1053  BP: 114/78  Pulse: 79  Temp: 37.4 C  Resp: 12    Complications: No apparent anesthesia complications

## 2015-01-20 NOTE — H&P (Signed)
59 year old woman for management of a right breast cancer status post neobladder and chemotherapy.  The patient does report a slightly productive cough of clear sputum.  No other cardiopulmonary symptoms.  For right breast wide excision possible mastectomy, sentinel node biopsy.

## 2015-01-20 NOTE — Discharge Instructions (Signed)

## 2015-01-21 LAB — SURGICAL PATHOLOGY

## 2015-01-25 ENCOUNTER — Telehealth: Payer: Self-pay | Admitting: *Deleted

## 2015-01-25 NOTE — Telephone Encounter (Signed)
-----   Message from Robert Bellow, MD sent at 01/25/2015  8:53 AM EDT ----- Please notify the patient that all the margins were clear and the lymph glands were negative on her recent breast excision sample. Thank you

## 2015-01-25 NOTE — Progress Notes (Signed)
  Oncology Nurse Navigator Documentation    Navigator Encounter Type: Telephone (01/25/15 1100) Patient Visit Type: Follow-up (01/25/15 1100) Treatment Phase:  (Surgery post neo-adjuvant chemotherapy) (01/25/15 1100) Barriers/Navigation Needs: Financial;Family concerns;Education (01/25/15 1100) Education: Concerns with Finances/ Eligibility (01/25/15 1100) Interventions: Coordination of Care;Education Method (01/25/15 1100)     Patient states he has follow-up appointment with Dr.Byrnett on 01/27/15.  She is hoping to change her appointment with Dr. Grayland Ormond from 02/17/15 to a closer date.  Plans to move to [place] with daughter, if physicians agreeable, and treatment at a point where that will work best for her.  She will call Thursday if needs assist with moving appointment.      Time Spent with Patient: 15 (01/25/15 1100)

## 2015-01-26 NOTE — Telephone Encounter (Signed)
Notified patient as instructed, patient pleased. Discussed follow-up appointments, tomorow, patient agrees

## 2015-01-27 ENCOUNTER — Encounter: Payer: Self-pay | Admitting: General Surgery

## 2015-01-27 ENCOUNTER — Ambulatory Visit (INDEPENDENT_AMBULATORY_CARE_PROVIDER_SITE_OTHER): Payer: BLUE CROSS/BLUE SHIELD | Admitting: General Surgery

## 2015-01-27 VITALS — BP 92/50 | HR 78 | Resp 14 | Ht 62.0 in | Wt 142.0 lb

## 2015-01-27 DIAGNOSIS — C50919 Malignant neoplasm of unspecified site of unspecified female breast: Secondary | ICD-10-CM

## 2015-01-27 DIAGNOSIS — C50911 Malignant neoplasm of unspecified site of right female breast: Secondary | ICD-10-CM

## 2015-01-27 NOTE — Progress Notes (Signed)
Patient ID: Gina Deleon, female   DOB: 09/21/1955, 59 y.o.   MRN: [id number]  Chief Complaint  Patient presents with  . Routine Post Op    HPI Gina Deleon is a 59 y.o. female.  Here today for her postoperative visit, right breast wide excision on 01-20-15. She states she is doing well. She states the axillary incision is still "sore".  HPI  Past Medical History  Diagnosis Date  . Depression   . Hypertension   . Hypercholesteremia   . 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  . Anemia     H/O  . Neuropathy   . S/P chemotherapy, time since less than 4 weeks     RT BREAST CA    Past Surgical History  Procedure Laterality Date  . Abdominal hysterectomy  1981  . Breast biopsy Right 2015    RT CORE W/CLIP - POS  . Portacath placement    . Breast lumpectomy with sentinel lymph node biopsy Right 01/20/2015    Procedure: Right breast wide excision with sentinel lymph node biopsy ;  Surgeon: Robert Bellow, MD;  Location: [facility] ORS;  Service: General;  Laterality: Right;    Family History  Problem Relation Age of Onset  . Hypertension Mother   . Diabetes Mother     Social History Social History  Substance Use Topics  . Smoking status: Current Every Day Smoker -- 1.00 packs/day for 20 years    Types: Cigarettes  . Smokeless tobacco: None  . Alcohol Use: 0.0 oz/week    0 Standard drinks or equivalent per week     Comment: [facility]    Allergies  Allergen Reactions  . No Known Allergies     Current Outpatient Prescriptions  Medication Sig Dispense Refill  . amLODipine (NORVASC) 5 MG tablet Take 1 tablet (5 mg total) by mouth daily. (Patient taking differently: Take 5 mg by mouth every morning. ) 30 tablet 1  . Aspirin-Acetaminophen (GOODYS BODY PAIN PO) Take 1 packet by mouth as needed.    . loteprednol (LOTEMAX) 0.5 % ophthalmic suspension Place 1 drop into both eyes 4 (four) times  daily. 5 mL 0  . potassium chloride SA (K-DUR,KLOR-CON) 20 MEQ tablet Take 1 tablet (20 mEq total) by mouth once. Take 20 mEq by mouth once. 30 tablet 1   No current facility-administered medications for this visit.   Facility-Administered Medications Ordered in Other Visits  Medication Dose Route Frequency Provider Last Rate Last Dose  . sodium chloride 0.9 % injection 10 mL  10 mL Intracatheter PRN Lloyd Huger, MD      . sodium chloride 0.9 % injection 10 mL  10 mL Intracatheter PRN Lloyd Huger, MD   10 mL at 11/10/14 1325  . sodium chloride 0.9 % injection 10 mL  10 mL Intracatheter PRN Lloyd Huger, MD      . sodium chloride 0.9 % injection 10 mL  10 mL Intracatheter PRN Lloyd Huger, MD   10 mL at 01/05/15 0911    Review of Systems Review of Systems  Constitutional: Negative.   Respiratory: Negative.   Cardiovascular: Negative.     Blood pressure 92/50, pulse 78, resp. rate 14, height 5' 2"  (1.575 m), weight 142 lb (64.411 kg).  Physical Exam Physical Exam  Constitutional: She is oriented to person, place, and time. She appears well-developed and well-nourished.  HENT:  Mouth/Throat: Oropharynx is  clear and moist.  Pulmonary/Chest:    Right breast incision from 10 to 2 healing well.  Lymphadenopathy:  Tiny irritation along incision right axilla, no drainage  Neurological: She is alert and oriented to person, place, and time.  Skin: Skin is warm.  Psychiatric: Her behavior is normal.    Data Reviewed PT1b,N0. Margins 3 mm minimum.  Assessment    Doing well status post breast conservation. Excellent response to the area adjuvant chemotherapy.    Plan    Case presented at [facility] tumor board. Candidate for whole breast radiation and peripheral lymphatics.    She is planning on moving to [place] to be with her daughter. She wants her port out before she moves. Appointment to see Radiation Oncologist.  The patient is aware to call back for  any questions or concerns. May use heating pad to the area for comfort as needed. May use antibiotic ointment to the axilla area to promote healing. Follow up on one month.   PCP:  No Pcp Dr. Tonny Bollman, [place] Gleason 02/01/2015, 11:47 AM

## 2015-01-27 NOTE — Patient Instructions (Addendum)
The patient is aware to call back for any questions or concerns. May use heating pad to the area for comfort as needed May use antibiotic ointment to the axilla area to promote healing

## 2015-02-01 ENCOUNTER — Ambulatory Visit
Admission: RE | Admit: 2015-02-01 | Discharge: 2015-02-01 | Disposition: A | Discharge status: Home / Self Care | Payer: BLUE CROSS/BLUE SHIELD | Source: Ambulatory Visit | Attending: Radiation Oncology | Admitting: Radiation Oncology

## 2015-02-01 ENCOUNTER — Encounter: Payer: Self-pay | Admitting: Radiation Oncology

## 2015-02-01 VITALS — BP 143/91 | HR 91 | Temp 98.2°F | Resp 18 | Ht 62.0 in | Wt 142.2 lb

## 2015-02-01 DIAGNOSIS — Z510 Encounter for antineoplastic radiation therapy: Secondary | ICD-10-CM | POA: Insufficient documentation

## 2015-02-01 DIAGNOSIS — C50911 Malignant neoplasm of unspecified site of right female breast: Secondary | ICD-10-CM | POA: Insufficient documentation

## 2015-02-01 DIAGNOSIS — C50919 Malignant neoplasm of unspecified site of unspecified female breast: Secondary | ICD-10-CM | POA: Insufficient documentation

## 2015-02-01 DIAGNOSIS — Z170 Estrogen receptor positive status [ER+]: Secondary | ICD-10-CM | POA: Insufficient documentation

## 2015-02-01 NOTE — Consult Note (Signed)
Except an outstanding is perfect of Radiation Oncology NEW PATIENT EVALUATION  Name: Gina Deleon  MRN: [id number]  Date:   02/01/2015     DOB: [birth year]   This 59 y.o. 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  REFERRING PHYSICIAN: No ref. provider found  CHIEF COMPLAINT:  Chief Complaint  Patient presents with  . Breast Cancer    Initial consult for radiation treatment    DIAGNOSIS: The encounter diagnosis was Malignant neoplasm of female breast, unspecified laterality.   PREVIOUS INVESTIGATIONS:  Mammograms reviewed PET CT scan remit reviewed Clinical notes reviewed Surgical pathology report reviewed  HPI: Patient is a 59 year old female who is had a known lesion in her right breast for several years. Recently presented to surgeon here with a 5.4 cm lobulated mass seen on mammogram as well as confirmed is a hypermetabolic lesion on PET CT scan February 2016. Also mildly hypermetabolic lymph node in the right axilla was noted. Axillary FNA was negative breast mass was positive for infiltrating ductal carcinoma ER positive PR negative HER-2/neu not overexpressed. Tumor had a high Oncotype DX score of 46 underwent new adjuvant chemotherapy using dose dense Adriamycin Cytoxan followed by Taxol. Taxol was discontinued 2 weeks short of her final treatment secondary to peripheral neuropathy. She is seen today for consideration of radiation therapy to her breast. She is doing well she specifically denies breast tenderness cough or bone pain. Patient does have a history of depression and a suicide attempt in the past. She has been started on gabapentin for her peripheral neuropathy. Patient underwent wide local excision and sentinel node biopsy. There was residual 9 mm of invasive carcinoma with both ductal and lobular  features. 2 sentinel lymph nodes were negative. Margins were clear but close at 3 mm. Patient is scheduled to move back to [place] in the next 2 months necessitating hypofractionated course of treatment. She is seen today for radiation oncology consultation.  PLANNED TREATMENT REGIMEN: Hypofractionated radiation therapy to right breast  PAST MEDICAL HISTORY:  has a past medical history of Depression; Hypertension; Hypercholesteremia; Breast cancer (May 2015); Anemia; Neuropathy; and S/P chemotherapy, time since less than 4 weeks.    PAST SURGICAL HISTORY:  Past Surgical History  Procedure Laterality Date  . Abdominal hysterectomy  1981  . Breast biopsy Right 2015    RT CORE W/CLIP - POS  . Portacath placement    . Breast lumpectomy with sentinel lymph node biopsy Right 01/20/2015    Procedure: Right breast wide excision with sentinel lymph node biopsy ;  Surgeon: Robert Bellow, MD;  Location: [facility] ORS;  Service: General;  Laterality: Right;    FAMILY HISTORY: family history includes Diabetes in her mother; Hypertension in her mother.  SOCIAL HISTORY:  reports that she has been smoking Cigarettes.  She has a 20 pack-year smoking history. She does not have any smokeless tobacco history on file. She reports that she drinks alcohol. She reports that she does not use illicit drugs.  ALLERGIES: No known allergies  MEDICATIONS:  Current Outpatient Prescriptions  Medication Sig Dispense Refill  . amLODipine (NORVASC) 5 MG tablet Take 1 tablet (5 mg total) by mouth daily. (Patient taking differently: Take 5 mg by mouth every morning. ) 30 tablet 1  . loteprednol (LOTEMAX) 0.5 % ophthalmic suspension Place 1 drop into both eyes 4 (four) times daily.  5 mL 0  . potassium chloride SA (K-DUR,KLOR-CON) 20 MEQ tablet Take 1 tablet (20 mEq total) by mouth once. Take 20 mEq by mouth once. 30 tablet 1  . Aspirin-Acetaminophen (GOODYS BODY PAIN PO) Take 1 packet by mouth as needed.     No current  facility-administered medications for this encounter.   Facility-Administered Medications Ordered in Other Encounters  Medication Dose Route Frequency Provider Last Rate Last Dose  . sodium chloride 0.9 % injection 10 mL  10 mL Intracatheter PRN Lloyd Huger, MD      . sodium chloride 0.9 % injection 10 mL  10 mL Intracatheter PRN Lloyd Huger, MD   10 mL at 11/10/14 1325  . sodium chloride 0.9 % injection 10 mL  10 mL Intracatheter PRN Lloyd Huger, MD      . sodium chloride 0.9 % injection 10 mL  10 mL Intracatheter PRN Lloyd Huger, MD   10 mL at 01/05/15 0911    ECOG PERFORMANCE STATUS:  0 - Asymptomatic  REVIEW OF SYSTEMS:  Patient denies any weight loss, fatigue, weakness, fever, chills or night sweats. Patient denies any loss of vision, blurred vision. Patient denies any ringing  of the ears or hearing loss. No irregular heartbeat. Patient denies heart murmur or history of fainting. Patient denies any chest pain or pain radiating to her upper extremities. Patient denies any shortness of breath, difficulty breathing at night, cough or hemoptysis. Patient denies any swelling in the lower legs. Patient denies any nausea vomiting, vomiting of blood, or coffee ground material in the vomitus. Patient denies any stomach pain. Patient states has had normal bowel movements no significant constipation or diarrhea. Patient denies any dysuria, hematuria or significant nocturia. Patient denies any problems walking, swelling in the joints or loss of balance. Patient denies any skin changes, loss of hair or loss of weight. Patient denies any excessive worrying or anxiety or significant depression. Patient denies any problems with insomnia. Patient denies excessive thirst, polyuria, polydipsia. Patient denies any swollen glands, patient denies easy bruising or easy bleeding. Patient denies any recent infections, allergies or URI. Patient "s visual fields have not changed significantly in  recent time.    PHYSICAL EXAM: BP 143/91 mmHg  Pulse 91  Temp(Src) 98.2 F (36.8 C)  Resp 18  Ht 5' 2"  (1.575 m)  Wt 142 lb 3.2 oz (64.5 kg)  BMI 26.00 kg/m2 Well-developed female in NAD. Breasts are rather large. She status post wide local excision of the right breast which is well-healed. No dominant mass or nodularity is noted in either breast in 2 positions examined. No cervical supraclavicular or axillary adenopathy is identified. Well-developed well-nourished patient in NAD. HEENT reveals PERLA, EOMI, discs not visualized.  Oral cavity is clear. No oral mucosal lesions are identified. Neck is clear without evidence of cervical or supraclavicular adenopathy. Lungs are clear to A&P. Cardiac examination is essentially unremarkable with regular rate and rhythm without murmur rub or thrill. Abdomen is benign with no organomegaly or masses noted. Motor sensory and DTR levels are equal and symmetric in the upper and lower extremities. Cranial nerves II through XII are grossly intact. Proprioception is intact. No peripheral adenopathy or edema is identified. No motor or sensory levels are noted. Crude visual fields are within normal range.   LABORATORY DATA: Surgical pathology reports reviewed    RADIOLOGY RESULTS: Mammograms and PET/CT scans reviewed   IMPRESSION: Stage IIa invasive mammary carcinoma of the right breast status post new  adjuvant chemotherapy wide local excision and sentinel node biopsy in 59 year old female with excellent response to therapy.  PLAN: At this time I have recommended whole breast radiation and the hypofractionated fractionated regimen based on her need to get back to [place] as soon as possible. We'll also boost her scar another 1600 cGy based on the close margin electron beam. I am not treating her peripheral lymphatics based on only mild hypermetabolic activity in her axillary lymph node as well as fine-needle aspiration which was negative initially and no  evidence of sentinel node involvement at the time of wide local excision. Risks and benefits of treatment include a skin reaction, fatigue, alteration of blood counts, all were described in detail to the patient. I believe her risk of skin reaction is heightened by the hypofractionated course again I've explained that to the patient. I have set up and ordered CT simulation later this week.  I would like to take this opportunity for allowing me to participate in the care of your patient.Armstead Peaks., MD

## 2015-02-02 ENCOUNTER — Ambulatory Visit
Admission: RE | Admit: 2015-02-02 | Discharge: 2015-02-02 | Disposition: A | Discharge status: Home / Self Care | Payer: BLUE CROSS/BLUE SHIELD | Source: Ambulatory Visit | Attending: Radiation Oncology | Admitting: Radiation Oncology

## 2015-02-02 DIAGNOSIS — Z510 Encounter for antineoplastic radiation therapy: Secondary | ICD-10-CM | POA: Diagnosis not present

## 2015-02-02 DIAGNOSIS — Z170 Estrogen receptor positive status [ER+]: Secondary | ICD-10-CM | POA: Diagnosis not present

## 2015-02-02 DIAGNOSIS — C50911 Malignant neoplasm of unspecified site of right female breast: Secondary | ICD-10-CM | POA: Diagnosis not present

## 2015-02-04 ENCOUNTER — Other Ambulatory Visit: Payer: Self-pay | Admitting: *Deleted

## 2015-02-04 DIAGNOSIS — C50911 Malignant neoplasm of unspecified site of right female breast: Secondary | ICD-10-CM

## 2015-02-07 DIAGNOSIS — C50911 Malignant neoplasm of unspecified site of right female breast: Secondary | ICD-10-CM | POA: Diagnosis not present

## 2015-02-09 ENCOUNTER — Ambulatory Visit
Admission: RE | Admit: 2015-02-09 | Discharge: 2015-02-09 | Disposition: A | Discharge status: Home / Self Care | Payer: BLUE CROSS/BLUE SHIELD | Source: Ambulatory Visit | Attending: Radiation Oncology | Admitting: Radiation Oncology

## 2015-02-09 DIAGNOSIS — C50911 Malignant neoplasm of unspecified site of right female breast: Secondary | ICD-10-CM | POA: Diagnosis not present

## 2015-02-10 ENCOUNTER — Ambulatory Visit
Admission: RE | Admit: 2015-02-10 | Discharge: 2015-02-10 | Disposition: A | Discharge status: Home / Self Care | Payer: BLUE CROSS/BLUE SHIELD | Source: Ambulatory Visit | Attending: Radiation Oncology | Admitting: Radiation Oncology

## 2015-02-10 ENCOUNTER — Inpatient Hospital Stay: Payer: BLUE CROSS/BLUE SHIELD

## 2015-02-10 DIAGNOSIS — C50911 Malignant neoplasm of unspecified site of right female breast: Secondary | ICD-10-CM | POA: Diagnosis not present

## 2015-02-11 ENCOUNTER — Inpatient Hospital Stay: Payer: BLUE CROSS/BLUE SHIELD

## 2015-02-11 ENCOUNTER — Ambulatory Visit
Admission: RE | Admit: 2015-02-11 | Discharge: 2015-02-11 | Disposition: A | Discharge status: Home / Self Care | Payer: BLUE CROSS/BLUE SHIELD | Source: Ambulatory Visit | Attending: Radiation Oncology | Admitting: Radiation Oncology

## 2015-02-11 DIAGNOSIS — C50911 Malignant neoplasm of unspecified site of right female breast: Secondary | ICD-10-CM | POA: Diagnosis not present

## 2015-02-14 ENCOUNTER — Ambulatory Visit
Admission: RE | Admit: 2015-02-14 | Discharge: 2015-02-14 | Disposition: A | Discharge status: Home / Self Care | Payer: BLUE CROSS/BLUE SHIELD | Source: Ambulatory Visit | Attending: Radiation Oncology | Admitting: Radiation Oncology

## 2015-02-14 ENCOUNTER — Inpatient Hospital Stay: Payer: BLUE CROSS/BLUE SHIELD

## 2015-02-14 DIAGNOSIS — C50911 Malignant neoplasm of unspecified site of right female breast: Secondary | ICD-10-CM | POA: Diagnosis not present

## 2015-02-15 ENCOUNTER — Ambulatory Visit
Admission: RE | Admit: 2015-02-15 | Discharge: 2015-02-15 | Disposition: A | Discharge status: Home / Self Care | Payer: BLUE CROSS/BLUE SHIELD | Source: Ambulatory Visit | Attending: Radiation Oncology | Admitting: Radiation Oncology

## 2015-02-15 ENCOUNTER — Inpatient Hospital Stay: Payer: BLUE CROSS/BLUE SHIELD

## 2015-02-15 DIAGNOSIS — C50911 Malignant neoplasm of unspecified site of right female breast: Secondary | ICD-10-CM | POA: Diagnosis not present

## 2015-02-16 ENCOUNTER — Inpatient Hospital Stay: Payer: BLUE CROSS/BLUE SHIELD

## 2015-02-16 ENCOUNTER — Inpatient Hospital Stay: Payer: BLUE CROSS/BLUE SHIELD | Admitting: Oncology

## 2015-02-16 ENCOUNTER — Inpatient Hospital Stay: Payer: BLUE CROSS/BLUE SHIELD | Attending: Oncology

## 2015-02-16 ENCOUNTER — Other Ambulatory Visit: Payer: Self-pay | Admitting: Oncology

## 2015-02-16 ENCOUNTER — Ambulatory Visit
Admission: RE | Admit: 2015-02-16 | Discharge: 2015-02-16 | Disposition: A | Discharge status: Home / Self Care | Payer: BLUE CROSS/BLUE SHIELD | Source: Ambulatory Visit | Attending: Radiation Oncology | Admitting: Radiation Oncology

## 2015-02-16 ENCOUNTER — Ambulatory Visit: Payer: BLUE CROSS/BLUE SHIELD

## 2015-02-16 ENCOUNTER — Ambulatory Visit: Payer: BLUE CROSS/BLUE SHIELD | Admitting: Oncology

## 2015-02-16 DIAGNOSIS — Z452 Encounter for adjustment and management of vascular access device: Secondary | ICD-10-CM | POA: Diagnosis not present

## 2015-02-16 DIAGNOSIS — C801 Malignant (primary) neoplasm, unspecified: Secondary | ICD-10-CM

## 2015-02-16 DIAGNOSIS — C50911 Malignant neoplasm of unspecified site of right female breast: Secondary | ICD-10-CM | POA: Insufficient documentation

## 2015-02-16 MED ORDER — SODIUM CHLORIDE 0.9 % IJ SOLN
10.0000 mL | Freq: Once | INTRAMUSCULAR | Status: AC
  Administered 2015-02-16: 10 mL via INTRAVENOUS
  Filled 2015-02-16: qty 10

## 2015-02-16 MED ORDER — AMLODIPINE BESYLATE 5 MG PO TABS: 5.0000 mg | ORAL_TABLET | Freq: Every day | ORAL | Status: DC

## 2015-02-16 MED ORDER — HEPARIN SOD (PORK) LOCK FLUSH 100 UNIT/ML IV SOLN
INTRAVENOUS | Status: AC
  Filled 2015-02-16: qty 5

## 2015-02-16 MED ORDER — HEPARIN SOD (PORK) LOCK FLUSH 100 UNIT/ML IV SOLN
500.0000 [IU] | Freq: Once | INTRAVENOUS | Status: AC
  Administered 2015-02-16: 500 [IU] via INTRAVENOUS

## 2015-02-16 MED ORDER — POTASSIUM CHLORIDE CRYS ER 20 MEQ PO TBCR: 20.0000 meq | EXTENDED_RELEASE_TABLET | Freq: Once | ORAL | Status: AC

## 2015-02-17 ENCOUNTER — Inpatient Hospital Stay: Payer: BLUE CROSS/BLUE SHIELD

## 2015-02-17 ENCOUNTER — Ambulatory Visit
Admission: RE | Admit: 2015-02-17 | Discharge: 2015-02-17 | Disposition: A | Discharge status: Home / Self Care | Payer: BLUE CROSS/BLUE SHIELD | Source: Ambulatory Visit | Attending: Radiation Oncology | Admitting: Radiation Oncology

## 2015-02-17 ENCOUNTER — Ambulatory Visit: Payer: BLUE CROSS/BLUE SHIELD

## 2015-02-18 ENCOUNTER — Inpatient Hospital Stay: Payer: BLUE CROSS/BLUE SHIELD

## 2015-02-18 ENCOUNTER — Ambulatory Visit: Payer: BLUE CROSS/BLUE SHIELD

## 2015-02-21 ENCOUNTER — Inpatient Hospital Stay: Payer: BLUE CROSS/BLUE SHIELD

## 2015-02-21 ENCOUNTER — Inpatient Hospital Stay: Payer: BLUE CROSS/BLUE SHIELD | Attending: Oncology

## 2015-02-21 ENCOUNTER — Ambulatory Visit
Admission: RE | Admit: 2015-02-21 | Discharge: 2015-02-21 | Disposition: A | Discharge status: Home / Self Care | Payer: BLUE CROSS/BLUE SHIELD | Source: Ambulatory Visit | Attending: Radiation Oncology | Admitting: Radiation Oncology

## 2015-02-21 DIAGNOSIS — C50911 Malignant neoplasm of unspecified site of right female breast: Secondary | ICD-10-CM

## 2015-02-21 DIAGNOSIS — F329 Major depressive disorder, single episode, unspecified: Secondary | ICD-10-CM | POA: Diagnosis not present

## 2015-02-21 DIAGNOSIS — R200 Anesthesia of skin: Secondary | ICD-10-CM | POA: Insufficient documentation

## 2015-02-21 DIAGNOSIS — D649 Anemia, unspecified: Secondary | ICD-10-CM | POA: Insufficient documentation

## 2015-02-21 DIAGNOSIS — R202 Paresthesia of skin: Secondary | ICD-10-CM | POA: Insufficient documentation

## 2015-02-21 DIAGNOSIS — I10 Essential (primary) hypertension: Secondary | ICD-10-CM | POA: Insufficient documentation

## 2015-02-21 DIAGNOSIS — Z9221 Personal history of antineoplastic chemotherapy: Secondary | ICD-10-CM | POA: Diagnosis not present

## 2015-02-21 DIAGNOSIS — Z23 Encounter for immunization: Secondary | ICD-10-CM | POA: Insufficient documentation

## 2015-02-21 DIAGNOSIS — F17210 Nicotine dependence, cigarettes, uncomplicated: Secondary | ICD-10-CM | POA: Insufficient documentation

## 2015-02-21 DIAGNOSIS — E7800 Pure hypercholesterolemia, unspecified: Secondary | ICD-10-CM | POA: Diagnosis not present

## 2015-02-21 DIAGNOSIS — Z170 Estrogen receptor positive status [ER+]: Secondary | ICD-10-CM | POA: Diagnosis not present

## 2015-02-21 LAB — CBC
HEMATOCRIT: 39.8 % (ref 35.0–47.0)
HEMOGLOBIN: 13.3 g/dL (ref 12.0–16.0)
MCH: 32 pg (ref 26.0–34.0)
MCHC: 33.4 g/dL (ref 32.0–36.0)
MCV: 95.6 fL (ref 80.0–100.0)
Platelets: 213 10*3/uL (ref 150–440)
RBC: 4.16 MIL/uL (ref 3.80–5.20)
RDW: 13.3 % (ref 11.5–14.5)
WBC: 5.4 10*3/uL (ref 3.6–11.0)

## 2015-02-22 ENCOUNTER — Ambulatory Visit
Admission: RE | Admit: 2015-02-22 | Discharge: 2015-02-22 | Disposition: A | Discharge status: Home / Self Care | Payer: BLUE CROSS/BLUE SHIELD | Source: Ambulatory Visit | Attending: Radiation Oncology | Admitting: Radiation Oncology

## 2015-02-22 ENCOUNTER — Other Ambulatory Visit: Payer: Self-pay | Admitting: *Deleted

## 2015-02-22 ENCOUNTER — Inpatient Hospital Stay: Payer: BLUE CROSS/BLUE SHIELD

## 2015-02-22 DIAGNOSIS — C50911 Malignant neoplasm of unspecified site of right female breast: Secondary | ICD-10-CM

## 2015-02-22 MED ORDER — GABAPENTIN 100 MG PO CAPS: 200.0000 mg | ORAL_CAPSULE | Freq: Two times a day (BID) | ORAL | Status: DC

## 2015-02-23 ENCOUNTER — Inpatient Hospital Stay: Payer: BLUE CROSS/BLUE SHIELD

## 2015-02-23 ENCOUNTER — Ambulatory Visit
Admission: RE | Admit: 2015-02-23 | Discharge: 2015-02-23 | Disposition: A | Discharge status: Home / Self Care | Payer: BLUE CROSS/BLUE SHIELD | Source: Ambulatory Visit | Attending: Radiation Oncology | Admitting: Radiation Oncology

## 2015-02-23 DIAGNOSIS — C50911 Malignant neoplasm of unspecified site of right female breast: Secondary | ICD-10-CM | POA: Diagnosis not present

## 2015-02-24 ENCOUNTER — Ambulatory Visit
Admission: RE | Admit: 2015-02-24 | Discharge: 2015-02-24 | Disposition: A | Discharge status: Home / Self Care | Payer: BLUE CROSS/BLUE SHIELD | Source: Ambulatory Visit | Attending: Radiation Oncology | Admitting: Radiation Oncology

## 2015-02-24 ENCOUNTER — Ambulatory Visit (INDEPENDENT_AMBULATORY_CARE_PROVIDER_SITE_OTHER): Payer: BLUE CROSS/BLUE SHIELD | Admitting: General Surgery

## 2015-02-24 ENCOUNTER — Inpatient Hospital Stay: Payer: BLUE CROSS/BLUE SHIELD

## 2015-02-24 ENCOUNTER — Other Ambulatory Visit: Payer: Self-pay | Admitting: *Deleted

## 2015-02-24 ENCOUNTER — Encounter: Payer: Self-pay | Admitting: General Surgery

## 2015-02-24 VITALS — BP 130/80 | HR 74 | Resp 12 | Ht 62.0 in | Wt 139.0 lb

## 2015-02-24 DIAGNOSIS — C50919 Malignant neoplasm of unspecified site of unspecified female breast: Secondary | ICD-10-CM

## 2015-02-24 DIAGNOSIS — C50911 Malignant neoplasm of unspecified site of right female breast: Secondary | ICD-10-CM | POA: Diagnosis not present

## 2015-02-24 NOTE — Patient Instructions (Addendum)
Follow up appointment to be announced.  

## 2015-02-24 NOTE — Progress Notes (Signed)
Patient ID: Gina Deleon, female   DOB: Dec 14, 1955, 59 y.o.   MRN: [id number]  Chief Complaint  Patient presents with  . Follow-up    breast cancer    HPI Gina Deleon is a 59 y.o. female Here today for her one month visit, right breast wide excision on 01-20-15. She states she is doing well.  HPI  Past Medical History  Diagnosis Date  . Depression   . Hypertension   . Hypercholesteremia   . 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  . Anemia     H/O  . Neuropathy ([place])   . S/P chemotherapy, time since less than 4 weeks     RT BREAST CA    Past Surgical History  Procedure Laterality Date  . Abdominal hysterectomy  1981  . Breast biopsy Right 2015    RT CORE W/CLIP - POS  . Portacath placement    . Breast lumpectomy with sentinel lymph node biopsy Right 01/20/2015    Procedure: Right breast wide excision with sentinel lymph node biopsy ;  Surgeon: Robert Bellow, MD;  Location: [facility] ORS;  Service: General;  Laterality: Right;    Family History  Problem Relation Age of Onset  . Hypertension Mother   . Diabetes Mother     Social History Social History  Substance Use Topics  . Smoking status: Current Every Day Smoker -- 1.00 packs/day for 20 years    Types: Cigarettes  . Smokeless tobacco: None  . Alcohol Use: 0.0 oz/week    0 Standard drinks or equivalent per week     Comment: OCC    Allergies  Allergen Reactions  . No Known Allergies     Current Outpatient Prescriptions  Medication Sig Dispense Refill  . amLODipine (NORVASC) 5 MG tablet Take 1 tablet (5 mg total) by mouth daily. 30 tablet 1  . Aspirin-Acetaminophen (GOODYS BODY PAIN PO) Take 1 packet by mouth as needed.    . gabapentin (NEURONTIN) 100 MG capsule Take 2 capsules (200 mg total) by mouth 2 (two) times daily. 120 capsule 1  . loteprednol (LOTEMAX) 0.5 % ophthalmic suspension Place 1 drop into both  eyes 4 (four) times daily. 5 mL 0  . potassium chloride SA (K-DUR,KLOR-CON) 20 MEQ tablet Take 1 tablet (20 mEq total) by mouth once. Take 20 mEq by mouth once. 30 tablet 1   No current facility-administered medications for this visit.   Facility-Administered Medications Ordered in Other Visits  Medication Dose Route Frequency Provider Last Rate Last Dose  . sodium chloride 0.9 % injection 10 mL  10 mL Intracatheter PRN Lloyd Huger, MD      . sodium chloride 0.9 % injection 10 mL  10 mL Intracatheter PRN Lloyd Huger, MD   10 mL at 11/10/14 1325  . sodium chloride 0.9 % injection 10 mL  10 mL Intracatheter PRN Lloyd Huger, MD      . sodium chloride 0.9 % injection 10 mL  10 mL Intracatheter PRN Lloyd Huger, MD   10 mL at 01/05/15 0911    Review of Systems Review of Systems  Constitutional: Negative.   Respiratory: Negative.   Cardiovascular: Negative.     Blood pressure 130/80, pulse 74, resp. rate 12, height 5' 2"  (1.575 m), weight 139 lb (63.05 kg).  Physical Exam Physical Exam  Constitutional: She is oriented to person, place, and time. She appears well-developed  and well-nourished.  Eyes: Conjunctivae are normal. No scleral icterus.  Neck: Neck supple.  Pulmonary/Chest:    Right breast incision looks clean.  Moderate  thickening along the scar. 8 to 10 thickening along the mammary fold. Mild skin changes from radiations.   Lymphadenopathy:    She has no cervical adenopathy.  Neurological: She is alert and oriented to person, place, and time.  Skin: Skin is warm and dry.      Assessment    Doing well status post wide excision right breast tumor status post new adjuvant chemotherapy.    Plan    The patient is planning to move to [place] sooner she completes her radiation therapy. She has to the port could be removed. We will get Dr. Gary Fleet opinion before removal.      Send note to Dr. Grayland Ormond to see about removing  port    Robert Bellow 02/25/2015, 5:21 PM

## 2015-02-25 ENCOUNTER — Inpatient Hospital Stay: Payer: BLUE CROSS/BLUE SHIELD

## 2015-02-25 ENCOUNTER — Ambulatory Visit
Admission: RE | Admit: 2015-02-25 | Discharge: 2015-02-25 | Disposition: A | Discharge status: Home / Self Care | Payer: BLUE CROSS/BLUE SHIELD | Source: Ambulatory Visit | Attending: Radiation Oncology | Admitting: Radiation Oncology

## 2015-02-25 DIAGNOSIS — C50911 Malignant neoplasm of unspecified site of right female breast: Secondary | ICD-10-CM | POA: Diagnosis not present

## 2015-02-27 NOTE — Progress Notes (Signed)
This encounter was created in error - please disregard.

## 2015-02-28 ENCOUNTER — Inpatient Hospital Stay: Payer: BLUE CROSS/BLUE SHIELD

## 2015-02-28 ENCOUNTER — Ambulatory Visit
Admission: RE | Admit: 2015-02-28 | Discharge: 2015-02-28 | Disposition: A | Discharge status: Home / Self Care | Payer: BLUE CROSS/BLUE SHIELD | Source: Ambulatory Visit | Attending: Radiation Oncology | Admitting: Radiation Oncology

## 2015-02-28 DIAGNOSIS — C801 Malignant (primary) neoplasm, unspecified: Secondary | ICD-10-CM

## 2015-02-28 DIAGNOSIS — C50911 Malignant neoplasm of unspecified site of right female breast: Secondary | ICD-10-CM | POA: Diagnosis not present

## 2015-02-28 MED ORDER — INFLUENZA VAC SPLIT QUAD 0.5 ML IM SUSY
0.5000 mL | PREFILLED_SYRINGE | Freq: Once | INTRAMUSCULAR | Status: AC
  Administered 2015-02-28: 0.5 mL via INTRAMUSCULAR
  Filled 2015-02-28: qty 0.5

## 2015-03-01 ENCOUNTER — Inpatient Hospital Stay: Payer: BLUE CROSS/BLUE SHIELD

## 2015-03-01 ENCOUNTER — Ambulatory Visit
Admission: RE | Admit: 2015-03-01 | Discharge: 2015-03-01 | Disposition: A | Discharge status: Home / Self Care | Payer: BLUE CROSS/BLUE SHIELD | Source: Ambulatory Visit | Attending: Radiation Oncology | Admitting: Radiation Oncology

## 2015-03-01 DIAGNOSIS — C50911 Malignant neoplasm of unspecified site of right female breast: Secondary | ICD-10-CM | POA: Diagnosis not present

## 2015-03-02 ENCOUNTER — Inpatient Hospital Stay: Payer: BLUE CROSS/BLUE SHIELD

## 2015-03-02 ENCOUNTER — Inpatient Hospital Stay: Payer: BLUE CROSS/BLUE SHIELD | Admitting: Oncology

## 2015-03-02 ENCOUNTER — Ambulatory Visit
Admission: RE | Admit: 2015-03-02 | Discharge: 2015-03-02 | Disposition: A | Discharge status: Home / Self Care | Payer: BLUE CROSS/BLUE SHIELD | Source: Ambulatory Visit | Attending: Radiation Oncology | Admitting: Radiation Oncology

## 2015-03-02 DIAGNOSIS — C50911 Malignant neoplasm of unspecified site of right female breast: Secondary | ICD-10-CM | POA: Diagnosis not present

## 2015-03-03 ENCOUNTER — Telehealth: Payer: Self-pay

## 2015-03-03 ENCOUNTER — Ambulatory Visit
Admission: RE | Admit: 2015-03-03 | Discharge: 2015-03-03 | Disposition: A | Discharge status: Home / Self Care | Payer: BLUE CROSS/BLUE SHIELD | Source: Ambulatory Visit | Attending: Radiation Oncology | Admitting: Radiation Oncology

## 2015-03-03 ENCOUNTER — Inpatient Hospital Stay: Payer: BLUE CROSS/BLUE SHIELD

## 2015-03-03 DIAGNOSIS — C50911 Malignant neoplasm of unspecified site of right female breast: Secondary | ICD-10-CM | POA: Diagnosis not present

## 2015-03-03 NOTE — Telephone Encounter (Signed)
-----   Message from Robert Bellow, MD sent at 03/02/2015  1:33 PM EDT ----- Please schedule an office visit for port removal. Thanks.

## 2015-03-03 NOTE — Telephone Encounter (Signed)
Spoke with patient about scheduling her port removal. Patient is scheduled to have this done here in the office on 03/31/15 at 3:00 pm. Call placed to [facility] at the [facility] to obtain an order for removal.

## 2015-03-04 ENCOUNTER — Ambulatory Visit: Payer: BLUE CROSS/BLUE SHIELD

## 2015-03-04 ENCOUNTER — Inpatient Hospital Stay: Payer: BLUE CROSS/BLUE SHIELD

## 2015-03-04 DIAGNOSIS — C50911 Malignant neoplasm of unspecified site of right female breast: Secondary | ICD-10-CM | POA: Diagnosis not present

## 2015-03-05 ENCOUNTER — Emergency Department: Payer: BLUE CROSS/BLUE SHIELD

## 2015-03-05 ENCOUNTER — Emergency Department
Admission: EM | Admit: 2015-03-05 | Discharge: 2015-03-05 | Disposition: A | Discharge status: Home / Self Care | Payer: BLUE CROSS/BLUE SHIELD | Attending: Emergency Medicine | Admitting: Emergency Medicine

## 2015-03-05 ENCOUNTER — Encounter: Payer: Self-pay | Admitting: Emergency Medicine

## 2015-03-05 DIAGNOSIS — K047 Periapical abscess without sinus: Secondary | ICD-10-CM | POA: Diagnosis not present

## 2015-03-05 DIAGNOSIS — Z720 Tobacco use: Secondary | ICD-10-CM | POA: Diagnosis not present

## 2015-03-05 DIAGNOSIS — I10 Essential (primary) hypertension: Secondary | ICD-10-CM | POA: Diagnosis not present

## 2015-03-05 DIAGNOSIS — G6189 Other inflammatory polyneuropathies: Secondary | ICD-10-CM | POA: Diagnosis not present

## 2015-03-05 DIAGNOSIS — R220 Localized swelling, mass and lump, head: Secondary | ICD-10-CM | POA: Diagnosis present

## 2015-03-05 LAB — CBC WITH DIFFERENTIAL/PLATELET
BASOS ABS: 0.1 10*3/uL (ref 0–0.1)
BASOS PCT: 1 %
Eosinophils Absolute: 0.2 10*3/uL (ref 0–0.7)
Eosinophils Relative: 3 %
HEMATOCRIT: 39.8 % (ref 35.0–47.0)
Hemoglobin: 13.3 g/dL (ref 12.0–16.0)
Lymphocytes Relative: 27 %
Lymphs Abs: 1.8 10*3/uL (ref 1.0–3.6)
MCH: 32 pg (ref 26.0–34.0)
MCHC: 33.3 g/dL (ref 32.0–36.0)
MCV: 96.2 fL (ref 80.0–100.0)
MONO ABS: 0.5 10*3/uL (ref 0.2–0.9)
MONOS PCT: 8 %
NEUTROS ABS: 4.1 10*3/uL (ref 1.4–6.5)
NEUTROS PCT: 61 %
Platelets: 187 10*3/uL (ref 150–440)
RBC: 4.14 MIL/uL (ref 3.80–5.20)
RDW: 13.4 % (ref 11.5–14.5)
WBC: 6.6 10*3/uL (ref 3.6–11.0)

## 2015-03-05 LAB — COMPREHENSIVE METABOLIC PANEL
ALBUMIN: 3.5 g/dL (ref 3.5–5.0)
ALT: 7 U/L — ABNORMAL LOW (ref 14–54)
ANION GAP: 6 (ref 5–15)
AST: 14 U/L — AB (ref 15–41)
Alkaline Phosphatase: 90 U/L (ref 38–126)
BILIRUBIN TOTAL: 0.5 mg/dL (ref 0.3–1.2)
BUN: 6 mg/dL (ref 6–20)
CALCIUM: 8.9 mg/dL (ref 8.9–10.3)
CO2: 26 mmol/L (ref 22–32)
Chloride: 107 mmol/L (ref 101–111)
Creatinine, Ser: 0.68 mg/dL (ref 0.44–1.00)
GFR calc Af Amer: >60 mL/min (ref >60)
GFR calc non Af Amer: >60 mL/min (ref >60)
Glucose, Bld: 92 mg/dL (ref 65–99)
POTASSIUM: 3.5 mmol/L (ref 3.5–5.1)
SODIUM: 139 mmol/L (ref 135–145)
TOTAL PROTEIN: 6.6 g/dL (ref 6.5–8.1)

## 2015-03-05 MED ORDER — MORPHINE SULFATE (PF) 4 MG/ML IV SOLN
4.0000 mg | Freq: Once | INTRAVENOUS | Status: AC
  Administered 2015-03-05: 4 mg via INTRAVENOUS
  Filled 2015-03-05: qty 1

## 2015-03-05 MED ORDER — CLINDAMYCIN HCL 300 MG PO CAPS: 300.0000 mg | ORAL_CAPSULE | Freq: Three times a day (TID) | ORAL | Status: AC

## 2015-03-05 MED ORDER — OXYCODONE-ACETAMINOPHEN 5-325 MG PO TABS: 2.0000 | ORAL_TABLET | Freq: Four times a day (QID) | ORAL | Status: DC | PRN

## 2015-03-05 MED ORDER — CLINDAMYCIN HCL 150 MG PO CAPS
300.0000 mg | ORAL_CAPSULE | Freq: Once | ORAL | Status: AC
  Administered 2015-03-05: 300 mg via ORAL
  Filled 2015-03-05: qty 2

## 2015-03-05 MED ORDER — IOHEXOL 300 MG/ML  SOLN
75.0000 mL | Freq: Once | INTRAMUSCULAR | Status: AC | PRN
  Administered 2015-03-05: 75 mL via INTRAVENOUS

## 2015-03-05 MED ORDER — OXYCODONE-ACETAMINOPHEN 5-325 MG PO TABS
2.0000 | ORAL_TABLET | Freq: Once | ORAL | Status: AC
  Administered 2015-03-05: 2 via ORAL
  Filled 2015-03-05: qty 2

## 2015-03-05 NOTE — ED Notes (Signed)
Pt to ed with c/o right side facial swelling and pain, bilat lower extremity pain and numbness and headache since last night.  Pt states she is currently being treated with radiation for cancer in right breast.

## 2015-03-05 NOTE — ED Notes (Signed)
Noted that pt had slight bruise at the insertion site the small swelling. Pt states it happened at ct. Iv flushes easily

## 2015-03-05 NOTE — Discharge Instructions (Signed)
Dental Abscess A dental abscess is a collection of pus in or around a tooth. CAUSES This condition is caused by a bacterial infection around the root of the tooth that involves the inner part of the tooth (pulp). It may result from:  Severe tooth decay.  Trauma to the tooth that allows bacteria to enter into the pulp, such as a broken or chipped tooth.  Severe gum disease around a tooth. SYMPTOMS Symptoms of this condition include:  Severe pain in and around the infected tooth.  Swelling and redness around the infected tooth, in the mouth, or in the face.  Tenderness.  Pus drainage.  Bad breath.  Bitter taste in the mouth.  Difficulty swallowing.  Difficulty opening the mouth.  Nausea.  Vomiting.  Chills.  Swollen neck glands.  Fever. DIAGNOSIS This condition is diagnosed with examination of the infected tooth. During the exam, your dentist may tap on the infected tooth. Your dentist will also ask about your medical and dental history and may order X-rays. TREATMENT This condition is treated by eliminating the infection. This may be done with:  Antibiotic medicine.  A root canal. This may be performed to save the tooth.  Pulling (extracting) the tooth. This may also involve draining the abscess. This is done if the tooth cannot be saved. HOME CARE INSTRUCTIONS  Take medicines only as directed by your dentist.  If you were prescribed antibiotic medicine, finish all of it even if you start to feel better.  Rinse your mouth (gargle) often with salt water to relieve pain or swelling.  Do not drive or operate heavy machinery while taking pain medicine.  Do not apply heat to the outside of your mouth.  Keep all follow-up visits as directed by your dentist. This is important. SEEK MEDICAL CARE IF:  Your pain is worse and is not helped by medicine. SEEK IMMEDIATE MEDICAL CARE IF:  You have a fever or chills.  Your symptoms suddenly get worse.  You have a  very bad headache.  You have problems breathing or swallowing.  You have trouble opening your mouth.  You have swelling in your neck or around your eye.   This information is not intended to replace advice given to you by your health care provider. Make sure you discuss any questions you have with your health care provider.   Document Released: 05/07/2005 Document Revised: 09/21/2014 Document Reviewed: 05/04/2014 Elsevier Interactive Patient Education 2016 [employer]  Neuropathic Pain Neuropathic pain is pain caused by damage to the nerves that are responsible for certain sensations in your body (sensory nerves). The pain can be caused by damage to:   The sensory nerves that send signals to your spinal cord and brain (peripheral nervous system).  The sensory nerves in your brain or spinal cord (central nervous system). Neuropathic pain can make you more sensitive to pain. What would be a minor sensation for most people may feel very painful if you have neuropathic pain. This is usually a long-term condition that can be difficult to treat. The type of pain can differ from person to person. It may start suddenly (acute), or it may develop slowly and last for a long time (chronic). Neuropathic pain may come and go as damaged nerves heal or may stay at the same level for years. It often causes emotional distress, loss of sleep, and a lower quality of life. CAUSES  The most common cause of damage to a sensory nerve is diabetes. Many other diseases and conditions  can also cause neuropathic pain. Causes of neuropathic pain can be classified as:  Toxic. Many drugs and chemicals can cause toxic damage. The most common cause of toxic neuropathic pain is damage from drug treatment for cancer (chemotherapy).  Metabolic. This type of pain can happen when a disease causes imbalances that damage nerves. Diabetes is the most common of these diseases. Vitamin B deficiency caused by long-term alcohol  abuse is another common cause.  Traumatic. Any injury that cuts, crushes, or stretches a nerve can cause damage and pain. A common example is feeling pain after losing an arm or leg (phantom limb pain).  Compression-related. If a sensory nerve gets trapped or compressed for a long period of time, the blood supply to the nerve can be cut off.  Vascular. Many blood vessel diseases can cause neuropathic pain by decreasing blood supply and oxygen to nerves.  Autoimmune. This type of pain results from diseases in which the body's defense system mistakenly attacks sensory nerves. Examples of autoimmune diseases that can cause neuropathic pain include lupus and multiple sclerosis.  Infectious. Many types of viral infections can damage sensory nerves and cause pain. Shingles infection is a common cause of this type of pain.  Inherited. Neuropathic pain can be a symptom of many diseases that are passed down through families (genetic). SIGNS AND SYMPTOMS  The main symptom is pain. Neuropathic pain is often described as:  Burning.  Shock-like.  Stinging.  Hot or cold.  Itching. DIAGNOSIS  No single test can diagnose neuropathic pain. Your health care provider will do a physical exam and ask you about your pain. You may use a pain scale to describe how bad your pain is. You may also have tests to see if you have a high sensitivity to pain and to help find the cause and location of any sensory nerve damage. These tests may include:  Imaging studies, such as:  X-rays.  CT scan.  MRI.  Nerve conduction studies to test how well nerve signals travel through your sensory nerves (electrodiagnostic testing).  Stimulating your sensory nerves through electrodes on your skin and measuring the response in your spinal cord and brain (somatosensory evoked potentials). TREATMENT  Treatment for neuropathic pain may change over time. You may need to try different treatment options or a combination of  treatments. Some options include:  Over-the-counter pain relievers.  Prescription medicines. Some medicines used to treat other conditions may also help neuropathic pain. These include medicines to:  Control seizures (anticonvulsants).  Relieve depression (antidepressants).  Prescription-strength pain relievers (narcotics). These are usually used when other pain relievers do not help.  Transcutaneous nerve stimulation (TENS). This uses electrical currents to block painful nerve signals. The treatment is painless.  Topical and local anesthetics. These are medicines that numb the nerves. They can be injected as a nerve block or applied to the skin.  Alternative treatments, such as:  Acupuncture.  Meditation.  Massage.  Physical therapy.  Pain management programs.  Counseling. HOME CARE INSTRUCTIONS  Learn as much as you can about your condition.  Take medicines only as directed by your health care provider.  Work closely with all your health care providers to find what works best for you.  Have a good support system at home.  Consider joining a chronic pain support group. SEEK MEDICAL CARE IF:  Your pain treatments are not helping.  You are having side effects from your medicines.  You are struggling with fatigue, mood changes, depression, or anxiety.  This information is not intended to replace advice given to you by your health care provider. Make sure you discuss any questions you have with your health care provider.   Document Released: 02/02/2004 Document Revised: 05/28/2014 Document Reviewed: 10/15/2013 Elsevier Interactive Patient Education [employer].

## 2015-03-05 NOTE — ED Provider Notes (Signed)
[facility] Emergency Department Provider Note     Time seen: ----------------------------------------- 3:06 PM on 03/05/2015 -----------------------------------------    I have reviewed the triage vital signs and the nursing notes.   HISTORY  Chief Complaint Facial Swelling    HPI Gina Deleon is a 59 y.o. female who presents to ER with right-sided facial swelling and pain. She also has pain on the bottom of both of her feet. She had a headache that started last night also, currently being treated with radiation for right breast cancer. She denies fevers, chills, chest pain, shortness of breath, nausea or vomiting. She had one episode of diarrhea. Pain in the right side of her face is dull, nothing makes it better or worse. She denies any toothaches.   Past Medical History  Diagnosis Date  . Depression   . Hypertension   . Hypercholesteremia   . 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  . Anemia     H/O  . Neuropathy ([place])   . S/P chemotherapy, time since less than 4 weeks     RT BREAST CA    Patient Active Problem List   Diagnosis Date Noted  . Breast cancer stage T1b, greater than 0.5 cm and less than or equal to 1 cm in greatest dimension ([place]) 02/01/2015  . S/P chemotherapy, time since less than 4 weeks   . Breast cancer ([place]) 07/15/2014  . Depression 07/15/2014    Past Surgical History  Procedure Laterality Date  . Abdominal hysterectomy  1981  . Breast biopsy Right 2015    RT CORE W/CLIP - POS  . Portacath placement    . Breast lumpectomy with sentinel lymph node biopsy Right 01/20/2015    Procedure: Right breast wide excision with sentinel lymph node biopsy ;  Surgeon: Robert Bellow, MD;  Location: [facility] ORS;  Service: General;  Laterality: Right;    Allergies No known allergies  Social History Social History  Substance Use  Topics  . Smoking status: Current Every Day Smoker -- 1.00 packs/day for 20 years    Types: Cigarettes  . Smokeless tobacco: None  . Alcohol Use: No     Comment: OCC    Review of Systems Constitutional: Negative for fever. Eyes: Negative for visual changes. ENT: Negative for sore throat. Positive for right-sided facial swelling and pain Cardiovascular: Negative for chest pain. Respiratory: Negative for shortness of breath. Gastrointestinal: Negative for abdominal pain, vomiting. Positive for diarrhea Genitourinary: Negative for dysuria. Musculoskeletal: Negative for back pain. Skin: Negative for rash. Neurological: Negative for headaches, focal weakness or numbness.  10-point ROS otherwise negative.  ____________________________________________   PHYSICAL EXAM:  VITAL SIGNS: ED Triage Vitals  Enc Vitals Group     BP 03/05/15 1458 147/98 mmHg     Pulse Rate 03/05/15 1458 104     Resp 03/05/15 1458 20     Temp 03/05/15 1458 98.5 F (36.9 C)     Temp Source 03/05/15 1458 Oral     SpO2 03/05/15 1458 100 %     Weight 03/05/15 1458 147 lb (66.679 kg)     Height 03/05/15 1458 5' 2"  (1.575 m)     Head Cir --      Peak Flow --      Pain Score 03/05/15 1458 10     Pain Loc --      Pain Edu? --  Excl. in [place]? --     Constitutional: Alert and oriented. Well appearing and in no distress. Eyes: Conjunctivae are normal. PERRL. Normal extraocular movements. ENT   Head: There is right-sided facial swelling mostly around the right zygoma and maxillary sinus   Nose: No congestion/rhinnorhea.   Mouth/Throat: Mucous membranes are moist. Generalized poor dentition. Upper teeth are not tender to touch with a tongue blade   Neck: No stridor. No adenopathy Cardiovascular: Normal rate, regular rhythm. Normal and symmetric distal pulses are present in all extremities. No murmurs, rubs, or gallops. Normal dorsalis pedis pulses bilaterally Respiratory: Normal respiratory  effort without tachypnea nor retractions. Breath sounds are clear and equal bilaterally. No wheezes/rales/rhonchi. Gastrointestinal: Soft and nontender. No distention. No abdominal bruits.  Musculoskeletal: Nontender with normal range of motion in all extremities. No joint effusions.  No lower extremity tenderness nor edema. Toes are diffusely tender to touch over the plantar aspect. Neurologic:  Normal speech and language. No gross focal neurologic deficits are appreciated. Speech is normal. No gait instability. Skin:  Skin is warm, dry and intact. No rash noted. Psychiatric: Mood and affect are normal. Speech and behavior are normal. Patient exhibits appropriate insight and judgment.  ____________________________________________  ED COURSE:  Pertinent labs & imaging results that were available during my care of the patient were reviewed by me and considered in my medical decision making (see chart for details). Patient is in no acute distress, likely dental abscess.  ____________________________________________    LABS (pertinent positives/negatives)  Labs Reviewed  COMPREHENSIVE METABOLIC PANEL - Abnormal; Notable for the following:    AST 14 (*)    ALT 7 (*)    All other components within normal limits  CBC WITH DIFFERENTIAL/PLATELET    RADIOLOGY Images were viewed by me  CT maxillofacial IMPRESSION: Right maxillary cellulitis with small abscess due to odontogenic infection involving the right maxillary canine tooth. ____________________________________________  FINAL ASSESSMENT AND PLAN  Dental abscess, peripheral neuropathy  Plan: Patient with labs and imaging as dictated above. Patient be started on clindamycin and pain medication. She is encouraged to have close follow-up with a dentist to have tooth extraction or root canal. She is stable for outpatient follow-up   Earleen Newport, MD   Earleen Newport, MD 03/05/15 [phone]

## 2015-03-07 ENCOUNTER — Inpatient Hospital Stay: Payer: BLUE CROSS/BLUE SHIELD

## 2015-03-07 ENCOUNTER — Ambulatory Visit
Admission: RE | Admit: 2015-03-07 | Discharge: 2015-03-07 | Disposition: A | Discharge status: Home / Self Care | Payer: BLUE CROSS/BLUE SHIELD | Source: Ambulatory Visit | Attending: Radiation Oncology | Admitting: Radiation Oncology

## 2015-03-07 ENCOUNTER — Other Ambulatory Visit: Payer: Self-pay | Admitting: *Deleted

## 2015-03-07 DIAGNOSIS — C50911 Malignant neoplasm of unspecified site of right female breast: Secondary | ICD-10-CM | POA: Diagnosis not present

## 2015-03-07 LAB — CBC
HEMATOCRIT: 39.8 % (ref 35.0–47.0)
Hemoglobin: 13.4 g/dL (ref 12.0–16.0)
MCH: 31.9 pg (ref 26.0–34.0)
MCHC: 33.7 g/dL (ref 32.0–36.0)
MCV: 94.8 fL (ref 80.0–100.0)
Platelets: 210 10*3/uL (ref 150–440)
RBC: 4.2 MIL/uL (ref 3.80–5.20)
RDW: 13.9 % (ref 11.5–14.5)
WBC: 7.4 10*3/uL (ref 3.6–11.0)

## 2015-03-08 ENCOUNTER — Ambulatory Visit
Admission: RE | Admit: 2015-03-08 | Discharge: 2015-03-08 | Disposition: A | Discharge status: Home / Self Care | Payer: BLUE CROSS/BLUE SHIELD | Source: Ambulatory Visit | Attending: Radiation Oncology | Admitting: Radiation Oncology

## 2015-03-08 ENCOUNTER — Inpatient Hospital Stay: Payer: BLUE CROSS/BLUE SHIELD

## 2015-03-08 DIAGNOSIS — C50911 Malignant neoplasm of unspecified site of right female breast: Secondary | ICD-10-CM | POA: Diagnosis not present

## 2015-03-09 ENCOUNTER — Ambulatory Visit
Admission: RE | Admit: 2015-03-09 | Discharge: 2015-03-09 | Disposition: A | Discharge status: Home / Self Care | Payer: BLUE CROSS/BLUE SHIELD | Source: Ambulatory Visit | Attending: Radiation Oncology | Admitting: Radiation Oncology

## 2015-03-09 ENCOUNTER — Inpatient Hospital Stay: Payer: BLUE CROSS/BLUE SHIELD

## 2015-03-09 DIAGNOSIS — C50911 Malignant neoplasm of unspecified site of right female breast: Secondary | ICD-10-CM | POA: Diagnosis not present

## 2015-03-10 ENCOUNTER — Ambulatory Visit
Admission: RE | Admit: 2015-03-10 | Discharge: 2015-03-10 | Disposition: A | Discharge status: Home / Self Care | Payer: BLUE CROSS/BLUE SHIELD | Source: Ambulatory Visit | Attending: Radiation Oncology | Admitting: Radiation Oncology

## 2015-03-10 ENCOUNTER — Inpatient Hospital Stay: Payer: BLUE CROSS/BLUE SHIELD

## 2015-03-10 DIAGNOSIS — C50911 Malignant neoplasm of unspecified site of right female breast: Secondary | ICD-10-CM | POA: Diagnosis not present

## 2015-03-11 ENCOUNTER — Inpatient Hospital Stay (HOSPITAL_BASED_OUTPATIENT_CLINIC_OR_DEPARTMENT_OTHER): Payer: BLUE CROSS/BLUE SHIELD | Admitting: Internal Medicine

## 2015-03-11 ENCOUNTER — Ambulatory Visit
Admission: RE | Admit: 2015-03-11 | Discharge: 2015-03-11 | Disposition: A | Discharge status: Home / Self Care | Payer: BLUE CROSS/BLUE SHIELD | Source: Ambulatory Visit | Attending: Radiation Oncology | Admitting: Radiation Oncology

## 2015-03-11 ENCOUNTER — Inpatient Hospital Stay: Payer: BLUE CROSS/BLUE SHIELD

## 2015-03-11 VITALS — BP 124/87 | HR 83 | Temp 95.2°F | Wt 133.8 lb

## 2015-03-11 DIAGNOSIS — Z170 Estrogen receptor positive status [ER+]: Secondary | ICD-10-CM

## 2015-03-11 DIAGNOSIS — I10 Essential (primary) hypertension: Secondary | ICD-10-CM

## 2015-03-11 DIAGNOSIS — Z9221 Personal history of antineoplastic chemotherapy: Secondary | ICD-10-CM

## 2015-03-11 DIAGNOSIS — R200 Anesthesia of skin: Secondary | ICD-10-CM

## 2015-03-11 DIAGNOSIS — G629 Polyneuropathy, unspecified: Secondary | ICD-10-CM

## 2015-03-11 DIAGNOSIS — Z23 Encounter for immunization: Secondary | ICD-10-CM | POA: Diagnosis not present

## 2015-03-11 DIAGNOSIS — D649 Anemia, unspecified: Secondary | ICD-10-CM

## 2015-03-11 DIAGNOSIS — R202 Paresthesia of skin: Secondary | ICD-10-CM

## 2015-03-11 DIAGNOSIS — F329 Major depressive disorder, single episode, unspecified: Secondary | ICD-10-CM

## 2015-03-11 DIAGNOSIS — C50911 Malignant neoplasm of unspecified site of right female breast: Secondary | ICD-10-CM

## 2015-03-11 DIAGNOSIS — E7800 Pure hypercholesterolemia, unspecified: Secondary | ICD-10-CM

## 2015-03-11 DIAGNOSIS — F17210 Nicotine dependence, cigarettes, uncomplicated: Secondary | ICD-10-CM | POA: Diagnosis not present

## 2015-03-11 DIAGNOSIS — C50919 Malignant neoplasm of unspecified site of unspecified female breast: Secondary | ICD-10-CM

## 2015-03-11 MED ORDER — GABAPENTIN 300 MG PO CAPS
300.0000 mg | ORAL_CAPSULE | Freq: Three times a day (TID) | ORAL | Status: AC
Start: 2015-03-11 — End: ?

## 2015-03-11 MED ORDER — ANASTROZOLE 1 MG PO TABS: 1.0000 mg | ORAL_TABLET | Freq: Every day | ORAL | Status: AC

## 2015-03-11 NOTE — Progress Notes (Signed)
Patient seen in ED last Saturday for an abcessed tooth.  Was prescribed Clendamycin 150 mg., Percocet for pain.   Patient states she is having tenderness in the bottom of her feet. States she is moving to [place] on November 12th.  Patient further states when she eats she throws it back up.  Patient does not have prescribed antiemetic.

## 2015-03-11 NOTE — Progress Notes (Signed)
[facility] OFFICE PROGRESS NOTE  Patient Care Team: No Pcp Per Patient as PCP - General (General Practice) Dallas Schimke, MD (Internal Medicine) Robert Bellow, MD (General Surgery)   SUMMARY OF ONCOLOGIC HISTORY:  # FEB 2016- RIGHT BREAST CA; IDC; clinical STAGE IIIA [T3N1] [T=5.5 x4.2CM] ER POS; PR-NEG; He 2 Neu NEG; ONCOTYPE RS-46; s/p NEO-ADJ chemo ddAC- taxol wx9 [held sec to PN];s/p LUMPECT & SLNBx  ypT1b psN=0;Pleomorphic Lobular ca s/p RT [finish end of OCT 2016];OCT 2016-START ARIMIDEX  # Peripheral neuropathy- G-2   # Hx Alcohol abuse/Hx Depression  INTERVAL HISTORY:  This is my first interaction with the patient since I joined the practice September 2016. I reviewed the patient's prior chart/pertinent labs/imaging in detail; findings are summarized.  This is a very pleasant 59 year old female patient with above history of stage II breast cancer ER positive PR negative HER-2/neu negative status post neoadjuvant chemotherapy followed by lumpectomy with sentinel lymph node biopsy. Patient is currently getting post lumpectomy radiation.  Patient because complaint is her tingling and numbness in her hands and feet. She is on Neurontin 200 mg twice a day; she is also taking oxycodone as needed.  Her appetite is good. She is not losing any weight. She has intermittent nausea without vomiting. She'll finish radiation the next 5 days.   REVIEW OF SYSTEMS:  A complete 10 point review of system is done which is negative except mentioned above/history of present illness.   PAST MEDICAL HISTORY :  Past Medical History  Diagnosis Date  . Depression   . Hypertension   . Hypercholesteremia   . 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  . Anemia     H/O  . Neuropathy ([place])   . S/P chemotherapy, time since less than 4 weeks     RT BREAST CA    PAST SURGICAL  HISTORY :   Past Surgical History  Procedure Laterality Date  . Abdominal hysterectomy  1981  . Breast biopsy Right 2015    RT CORE W/CLIP - POS  . Portacath placement    . Breast lumpectomy with sentinel lymph node biopsy Right 01/20/2015    Procedure: Right breast wide excision with sentinel lymph node biopsy ;  Surgeon: Robert Bellow, MD;  Location: [facility] ORS;  Service: General;  Laterality: Right;    FAMILY HISTORY :   Family History  Problem Relation Age of Onset  . Hypertension Mother   . Diabetes Mother     SOCIAL HISTORY:   Social History  Substance Use Topics  . Smoking status: Current Every Day Smoker -- 1.00 packs/day for 20 years    Types: Cigarettes  . Smokeless tobacco: Not on file  . Alcohol Use: No     Comment: OCC    ALLERGIES:  is allergic to no known allergies.  MEDICATIONS:  Current Outpatient Prescriptions  Medication Sig Dispense Refill  . amLODipine (NORVASC) 5 MG tablet Take 1 tablet (5 mg total) by mouth daily. 30 tablet 1  . clindamycin (CLEOCIN) 300 MG capsule Take 1 capsule (300 mg total) by mouth 3 (three) times daily. 30 capsule 0  . gabapentin (NEURONTIN) 100 MG capsule Take 2 capsules (200 mg total) by mouth 2 (two) times daily. 120 capsule 1  . loteprednol (LOTEMAX) 0.5 % ophthalmic suspension Place 1 drop into both eyes 4 (four) times daily. 5 mL 0  . oxyCODONE-acetaminophen (PERCOCET)  5-325 MG tablet Take 2 tablets by mouth every 6 (six) hours as needed for moderate pain or severe pain. 30 tablet 0  . potassium chloride SA (K-DUR,KLOR-CON) 20 MEQ tablet Take 1 tablet (20 mEq total) by mouth once. Take 20 mEq by mouth once. (Patient taking differently: Take 20 mEq by mouth daily. Take 20 mEq by mouth once.) 30 tablet 1   No current facility-administered medications for this visit.   Facility-Administered Medications Ordered in Other Visits  Medication Dose Route Frequency Provider Last Rate Last Dose  . sodium chloride 0.9 % injection 10  mL  10 mL Intracatheter PRN Lloyd Huger, MD      . sodium chloride 0.9 % injection 10 mL  10 mL Intracatheter PRN Lloyd Huger, MD   10 mL at 11/10/14 1325  . sodium chloride 0.9 % injection 10 mL  10 mL Intracatheter PRN Lloyd Huger, MD      . sodium chloride 0.9 % injection 10 mL  10 mL Intracatheter PRN Lloyd Huger, MD   10 mL at 01/05/15 0911    PHYSICAL EXAMINATION: ECOG PERFORMANCE STATUS: 1 - Symptomatic but completely ambulatory  BP 124/87 mmHg  Pulse 83  Temp(Src) 95.2 F (35.1 C) (Tympanic)  Wt 133 lb 13.1 oz (60.7 kg)  Filed Weights   03/11/15 0846  Weight: 133 lb 13.1 oz (60.7 kg)    GENERAL: Well-nourished well-developed; Alert, no distress and comfortable.   Alone.  EYES: no pallor or icterus OROPHARYNX: no thrush or ulceration; good dentition  NECK: supple, no masses felt LYMPH:  no palpable lymphadenopathy in the cervical, axillary or inguinal regions LUNGS: clear to auscultation and  No wheeze or crackles HEART/CVS: regular rate & rhythm and no murmurs; No lower extremity edema ABDOMEN:abdomen soft, non-tender and normal bowel sounds Musculoskeletal:no cyanosis of digits and no clubbing  PSYCH: alert & oriented x 3 with fluent speech NEURO: no focal motor/sensory deficits SKIN:  no rashes or significant lesions; fingernails are pigmented.  LABORATORY DATA:  I have reviewed the data as listed    Component Value Date/Time   NA 139 03/05/2015 1543   NA 139 09/15/2014 0830   K 3.5 03/05/2015 1543   K 3.9 09/15/2014 0830   CL 107 03/05/2015 1543   CL 109 09/15/2014 0830   CO2 26 03/05/2015 1543   CO2 27 09/15/2014 0830   GLUCOSE 92 03/05/2015 1543   GLUCOSE 90 09/15/2014 0830   BUN 6 03/05/2015 1543   BUN 7 09/15/2014 0830   CREATININE 0.68 03/05/2015 1543   CREATININE 0.65 09/15/2014 0830   CALCIUM 8.9 03/05/2015 1543   CALCIUM 9.1 09/15/2014 0830   PROT 6.6 03/05/2015 1543   PROT 6.9 09/15/2014 0830   ALBUMIN 3.5  03/05/2015 1543   ALBUMIN 3.7 09/15/2014 0830   AST 14* 03/05/2015 1543   AST 11* 09/15/2014 0830   ALT 7* 03/05/2015 1543   ALT 7* 09/15/2014 0830   ALKPHOS 90 03/05/2015 1543   ALKPHOS 79 09/15/2014 0830   BILITOT 0.5 03/05/2015 1543   BILITOT 0.4 09/15/2014 0830   GFRNONAA >60 03/05/2015 1543   GFRNONAA >60 09/15/2014 0830   GFRAA >60 03/05/2015 1543   GFRAA >60 09/15/2014 0830    No results found for: SPEP, UPEP  Lab Results  Component Value Date   WBC 7.4 03/07/2015   NEUTROABS 4.1 03/05/2015   HGB 13.4 03/07/2015   HCT 39.8 03/07/2015   MCV 94.8 03/07/2015   PLT 210 03/07/2015  Chemistry      Component Value Date/Time   NA 139 03/05/2015 1543   NA 139 09/15/2014 0830   K 3.5 03/05/2015 1543   K 3.9 09/15/2014 0830   CL 107 03/05/2015 1543   CL 109 09/15/2014 0830   CO2 26 03/05/2015 1543   CO2 27 09/15/2014 0830   BUN 6 03/05/2015 1543   BUN 7 09/15/2014 0830   CREATININE 0.68 03/05/2015 1543   CREATININE 0.65 09/15/2014 0830      Component Value Date/Time   CALCIUM 8.9 03/05/2015 1543   CALCIUM 9.1 09/15/2014 0830   ALKPHOS 90 03/05/2015 1543   ALKPHOS 79 09/15/2014 0830   AST 14* 03/05/2015 1543   AST 11* 09/15/2014 0830   ALT 7* 03/05/2015 1543   ALT 7* 09/15/2014 0830   BILITOT 0.5 03/05/2015 1543   BILITOT 0.4 09/15/2014 0830       RADIOGRAPHIC STUDIES: I have personally reviewed the radiological images as listed and agreed with the findings in the report. No results found.   ASSESSMENT & PLAN:    59 year old female patient with above history of stage clinical III A breast cancer ER positive PR negative HER-2/neu negative status post neoadjuvant chemotherapy followed by lumpectomy with sentinel lymph node biopsy. Patient appears to have a great response from chemotherapy.  Patient is currently getting post lumpectomy radiation.  # I discussed the role of adjuvant Arimidex. Recommend 5-10 years [my preference would be 10 years given  high risk Oncotype]. Also discussed the potential side effects including but not limited to osteoporosis/joint pains muscle pain. Recommend calcium and vitamin D.  # Peripheral neuropathy- grade 2; recommend going up on the Neurontin to 300 mg 3 times a day; along with oxycodone for breakthrough pain.  # Patient plans to move to [place] middle of next month. Recommend set up an oncologist appointment there in the next 1 month or so.  The above plan of care was discussed the patient in detail. All questions were answered.    No orders of the defined types were placed in this encounter.   All questions were answered. The patient knows to call the clinic with any problems, questions or concerns. No barriers to learning was detected. I spent 25 minutes counseling the patient face to face. The total time spent in the appointment was 30 minutes and more than 50% was on counseling and review of test results     Cammie Sickle, MD 03/11/2015 8:57 AM

## 2015-03-14 ENCOUNTER — Inpatient Hospital Stay: Payer: BLUE CROSS/BLUE SHIELD

## 2015-03-14 ENCOUNTER — Ambulatory Visit
Admission: RE | Admit: 2015-03-14 | Discharge: 2015-03-14 | Disposition: A | Discharge status: Home / Self Care | Payer: BLUE CROSS/BLUE SHIELD | Source: Ambulatory Visit | Attending: Radiation Oncology | Admitting: Radiation Oncology

## 2015-03-14 DIAGNOSIS — C50911 Malignant neoplasm of unspecified site of right female breast: Secondary | ICD-10-CM | POA: Diagnosis not present

## 2015-03-15 ENCOUNTER — Ambulatory Visit: Payer: BLUE CROSS/BLUE SHIELD

## 2015-03-15 ENCOUNTER — Ambulatory Visit
Admission: RE | Admit: 2015-03-15 | Discharge: 2015-03-15 | Disposition: A | Discharge status: Home / Self Care | Payer: BLUE CROSS/BLUE SHIELD | Source: Ambulatory Visit | Attending: Radiation Oncology | Admitting: Radiation Oncology

## 2015-03-15 ENCOUNTER — Inpatient Hospital Stay: Payer: BLUE CROSS/BLUE SHIELD

## 2015-03-15 DIAGNOSIS — C50911 Malignant neoplasm of unspecified site of right female breast: Secondary | ICD-10-CM | POA: Diagnosis not present

## 2015-03-16 ENCOUNTER — Ambulatory Visit: Payer: BLUE CROSS/BLUE SHIELD

## 2015-03-16 DIAGNOSIS — C50911 Malignant neoplasm of unspecified site of right female breast: Secondary | ICD-10-CM | POA: Diagnosis not present

## 2015-03-17 ENCOUNTER — Ambulatory Visit
Admission: RE | Admit: 2015-03-17 | Discharge: 2015-03-17 | Disposition: A | Discharge status: Home / Self Care | Payer: BLUE CROSS/BLUE SHIELD | Source: Ambulatory Visit | Attending: Radiation Oncology | Admitting: Radiation Oncology

## 2015-03-17 DIAGNOSIS — C50911 Malignant neoplasm of unspecified site of right female breast: Secondary | ICD-10-CM | POA: Diagnosis not present

## 2015-03-30 ENCOUNTER — Telehealth: Payer: Self-pay | Admitting: *Deleted

## 2015-03-30 NOTE — Telephone Encounter (Signed)
Called requesting an order for port removal. Patient has an appt tomorrow and will not be able to remove it without an order.  She is marked No Show for most of her Radiation appts

## 2015-03-30 NOTE — Telephone Encounter (Signed)
Called pt back and sent fax order for port removal and she has an appt 3/10 at 3 pm. Pt aware

## 2015-03-31 ENCOUNTER — Inpatient Hospital Stay: Payer: BLUE CROSS/BLUE SHIELD | Attending: Oncology

## 2015-03-31 ENCOUNTER — Ambulatory Visit: Payer: BLUE CROSS/BLUE SHIELD | Admitting: Oncology

## 2015-03-31 ENCOUNTER — Encounter: Payer: Self-pay | Admitting: General Surgery

## 2015-03-31 ENCOUNTER — Ambulatory Visit (INDEPENDENT_AMBULATORY_CARE_PROVIDER_SITE_OTHER): Payer: BLUE CROSS/BLUE SHIELD | Admitting: General Surgery

## 2015-03-31 VITALS — BP 128/62 | HR 82 | Resp 14 | Ht 62.0 in | Wt 136.0 lb

## 2015-03-31 DIAGNOSIS — C50919 Malignant neoplasm of unspecified site of unspecified female breast: Secondary | ICD-10-CM

## 2015-03-31 NOTE — Patient Instructions (Addendum)
Keep area clean May leave dressing in place for 2-3 days. The steri strips will gradually fall off

## 2015-03-31 NOTE — Progress Notes (Signed)
Patient ID: Gina Deleon, female   DOB: April 14, 1956, 58 y.o.   MRN: [id number]  Chief Complaint  Patient presents with  . Procedure    HPI Gina Deleon is a 59 y.o. female.  Here today for port removal. Were received from medical oncology. She is leaving for [place] on Saturday.   HPI  Past Medical History  Diagnosis Date  . Depression   . Hypertension   . Hypercholesteremia   . 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  . Anemia     H/O  . Neuropathy ([place])   . S/P chemotherapy, time since less than 4 weeks     RT BREAST CA    Past Surgical History  Procedure Laterality Date  . Abdominal hysterectomy  1981  . Breast biopsy Right 2015    RT CORE W/CLIP - POS  . Portacath placement    . Breast lumpectomy with sentinel lymph node biopsy Right 01/20/2015    Procedure: Right breast wide excision with sentinel lymph node biopsy ;  Surgeon: Robert Bellow, MD;  Location: [facility] ORS;  Service: General;  Laterality: Right;    Family History  Problem Relation Age of Onset  . Hypertension Mother   . Diabetes Mother     Social History Social History  Substance Use Topics  . Smoking status: Current Every Day Smoker -- 1.00 packs/day for 20 years    Types: Cigarettes  . Smokeless tobacco: None  . Alcohol Use: No     Comment: OCC    Allergies  Allergen Reactions  . No Known Allergies     Current Outpatient Prescriptions  Medication Sig Dispense Refill  . amLODipine (NORVASC) 5 MG tablet Take 1 tablet (5 mg total) by mouth daily. 30 tablet 1  . anastrozole (ARIMIDEX) 1 MG tablet Take 1 tablet (1 mg total) by mouth daily. 90 tablet 0  . clindamycin (CLEOCIN) 300 MG capsule Take 1 capsule (300 mg total) by mouth 3 (three) times daily. 30 capsule 0  . gabapentin (NEURONTIN) 300 MG capsule Take 1 capsule (300 mg total) by mouth 3 (three) times daily. 90 capsule 2  . loteprednol  (LOTEMAX) 0.5 % ophthalmic suspension Place 1 drop into both eyes 4 (four) times daily. 5 mL 0  . potassium chloride SA (K-DUR,KLOR-CON) 20 MEQ tablet Take 1 tablet (20 mEq total) by mouth once. Take 20 mEq by mouth once. (Patient taking differently: Take 20 mEq by mouth daily. Take 20 mEq by mouth once.) 30 tablet 1   No current facility-administered medications for this visit.   Facility-Administered Medications Ordered in Other Visits  Medication Dose Route Frequency Provider Last Rate Last Dose  . sodium chloride 0.9 % injection 10 mL  10 mL Intracatheter PRN Lloyd Huger, MD      . sodium chloride 0.9 % injection 10 mL  10 mL Intracatheter PRN Lloyd Huger, MD   10 mL at 11/10/14 1325  . sodium chloride 0.9 % injection 10 mL  10 mL Intracatheter PRN Lloyd Huger, MD      . sodium chloride 0.9 % injection 10 mL  10 mL Intracatheter PRN Lloyd Huger, MD   10 mL at 01/05/15 0911    Review of Systems Review of Systems  Constitutional: Negative.   Respiratory: Negative.   Cardiovascular: Negative.     Blood pressure 128/62, pulse 82, resp. rate 14, height 5' 2"  (1.575  m), weight 136 lb (61.689 kg).  Physical Exam Physical Exam  Constitutional: She is oriented to person, place, and time. She appears well-developed and well-nourished.  Pulmonary/Chest:    Neurological: She is alert and oriented to person, place, and time.  Skin: Skin is warm and dry.  Psychiatric: Her behavior is normal.      Assessment    Completed planned chemotherapy.    Plan    Port removal procedure was reviewed. 10 mL of 0.5% Xylocaine with 0.25% Marcaine with 1-200,000 of epinephrine was utilized well tolerated. ChloraPrep was applied to the skin. The old incision was opened and the port was removed without incident. Complete catheter extraction confirmed with visualization of the distal tip. No bleeding. Adipose tissue was closed with a running 3-0 Vicryl suture. Skin was closed  in a similar fashion with a running 3-0 Vicryl subcuticular suture. Benzoin, Steri-Strips, Telfa and Tegaderm dressing applied.  Postbiopsy wound care reviewed.  The patient is moving to [place] on Saturday to live with her daughter. She'll report any problems by phone.    PCP:  No Pcp  Ref: Dr Lucilla Edin, Forest Gleason 04/01/2015, 5:28 AM

## 2015-03-31 NOTE — Progress Notes (Unsigned)
Survivorship Care Plan visit completed.  Treatment summary reviewed and given to patient.  ASCO answers booklet reviewed and given to patient.  CARE program and Cancer Transitions discussed with patient along with other resources the Cancer Center provides.  Patient verbalized understanding.   

## 2015-04-11 ENCOUNTER — Ambulatory Visit: Payer: BLUE CROSS/BLUE SHIELD | Admitting: General Surgery

## 2015-04-22 ENCOUNTER — Ambulatory Visit: Payer: BLUE CROSS/BLUE SHIELD | Admitting: Radiation Oncology

## 2015-04-29 ENCOUNTER — Ambulatory Visit: Payer: BLUE CROSS/BLUE SHIELD | Admitting: Radiation Oncology

## 2015-04-29 ENCOUNTER — Ambulatory Visit
Admission: RE | Admit: 2015-04-29 | Discharge: 2015-04-29 | Disposition: A | Discharge status: Home / Self Care | Payer: BLUE CROSS/BLUE SHIELD | Source: Ambulatory Visit | Attending: Radiation Oncology | Admitting: Radiation Oncology

## 2015-08-08 ENCOUNTER — Other Ambulatory Visit: Payer: Self-pay | Admitting: *Deleted

## 2015-08-08 DIAGNOSIS — I10 Essential (primary) hypertension: Secondary | ICD-10-CM

## 2015-08-08 DIAGNOSIS — C50421 Malignant neoplasm of upper-outer quadrant of right male breast: Secondary | ICD-10-CM

## 2015-08-08 MED ORDER — AMLODIPINE BESYLATE 5 MG PO TABS: 5.0000 mg | ORAL_TABLET | Freq: Every day | ORAL | Status: AC

## 2015-08-08 NOTE — Progress Notes (Signed)
RF request rcvd from [facility] for RF of Norvasc 5 mg. Per Dr. Rogue Bussing, pt's pcp needs to be the prescribing doctor. However if pt does not have pcp, then we can send RF request to patient's pharmacy.  I contacted the patient. She does not have a PCP. Notified pt that MD will RF this request will be RF but the next RF will need to come from a PCP.

## 2015-10-25 IMAGING — CT CT MAXILLOFACIAL W/ CM
3 series · 16 of 47 positions shown, 19 images · IV contrast (omnipaque)
Comparison: None.

CLINICAL DATA: Right-sided facial pain and swelling. Currently
undergoing radiation therapy for right breast carcinoma.

EXAM:
CT MAXILLOFACIAL WITH CONTRAST
TECHNIQUE: Multidetector CT imaging of the maxillofacial structures was
performed with intravenous contrast. Multiplanar CT image
reconstructions were also generated. A small metallic BB was placed
on the right temple in order to reliably differentiate right from
left.
CONTRAST:  75mL OMNIPAQUE IOHEXOL 300 MG/ML  SOLN

[Series 2: max soft · axial · 0.31mm/px · z∈[-250,-120]mm · 10 of 77 slices shown, 13 images]
[im 6/77  brain]
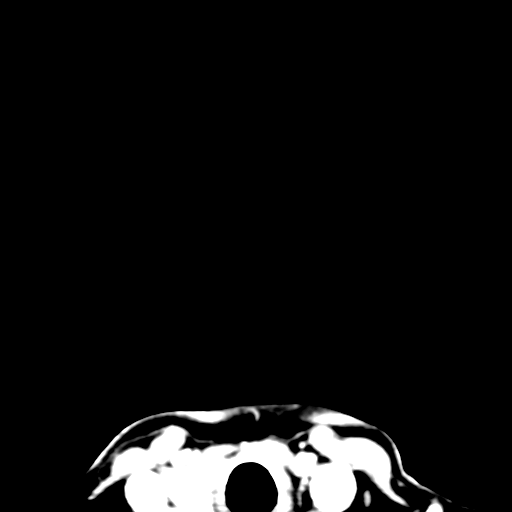
[im 6/77  bone]
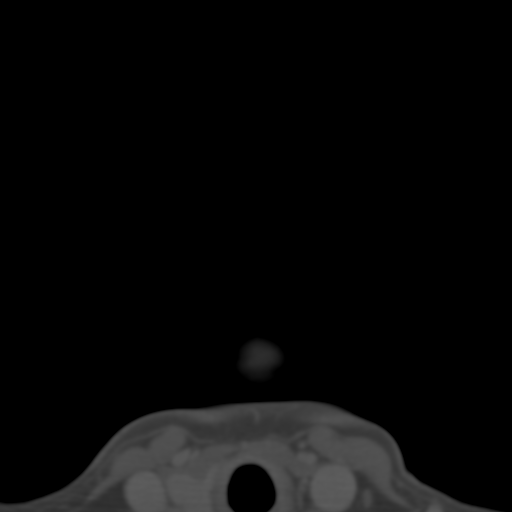
[im 14/77  bone]
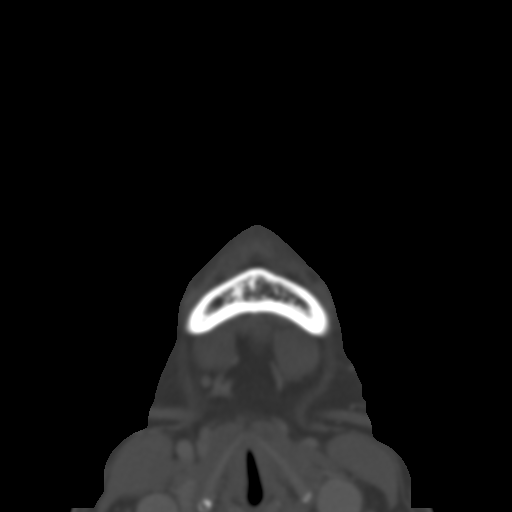
[im 21/77  bone]
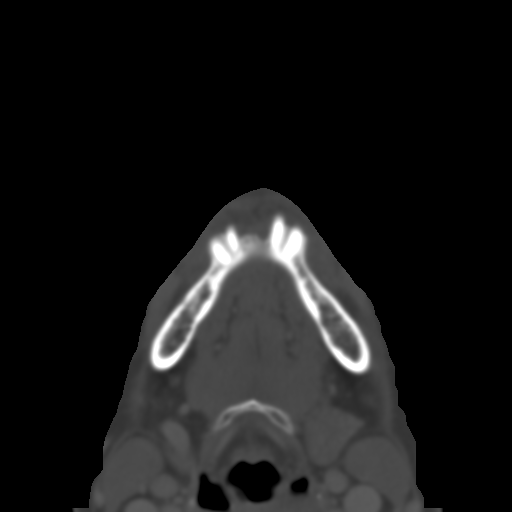
[im 27/77  bone]
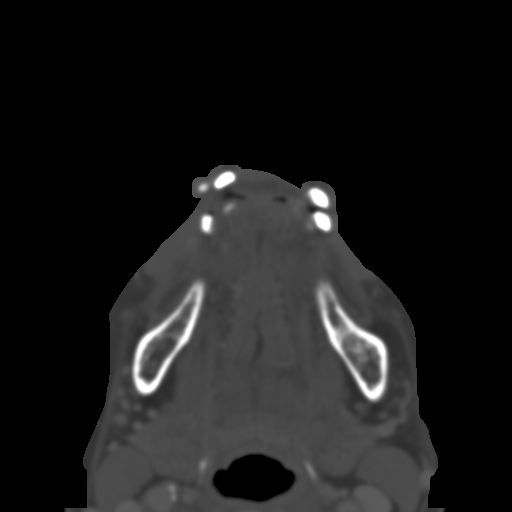
[im 35/77  brain]
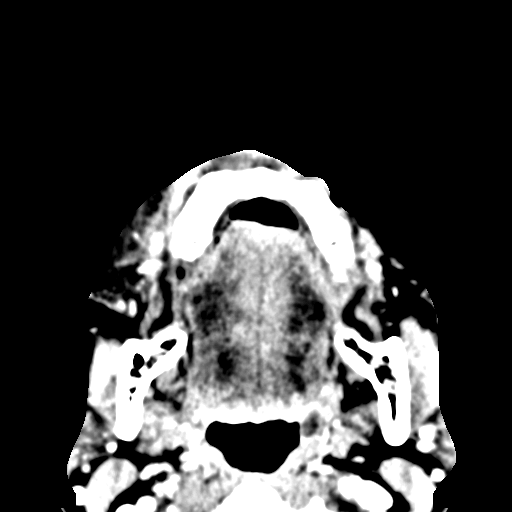
[im 35/77  bone]
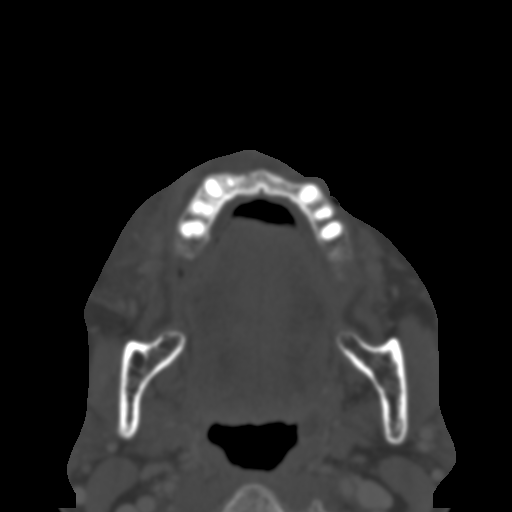
[im 42/77  bone]
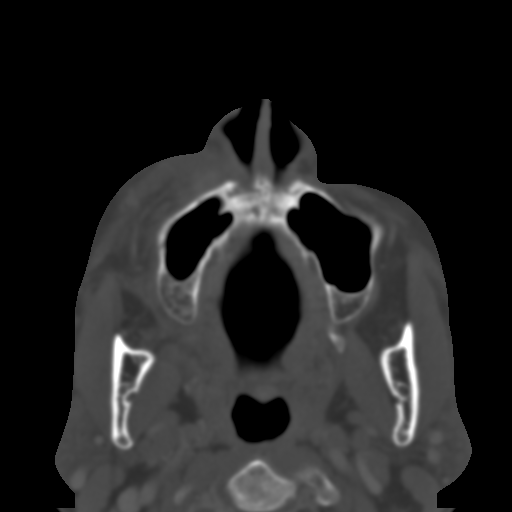
[im 50/77  bone]
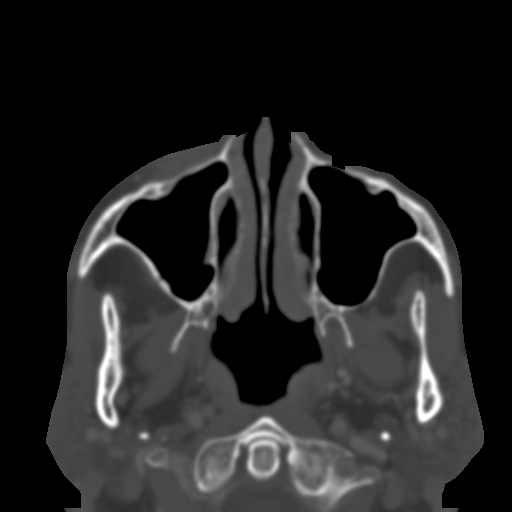
[im 58/77  bone]
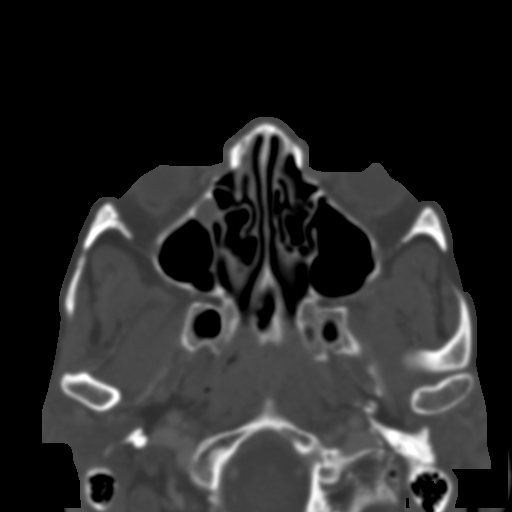
[im 63/77  brain]
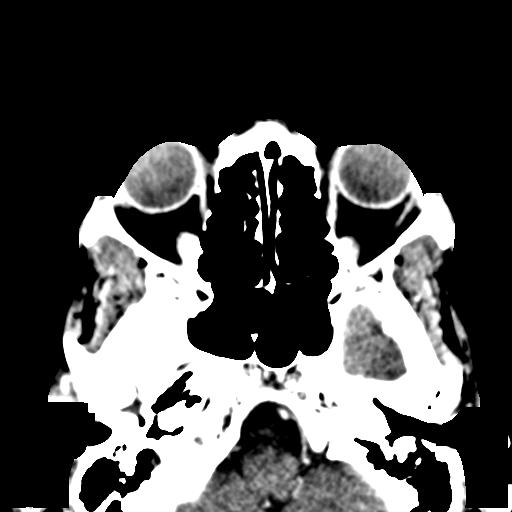
[im 63/77  bone]
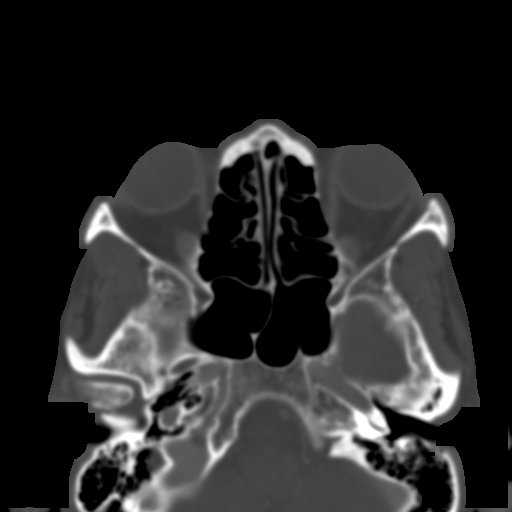
[im 71/77  bone]
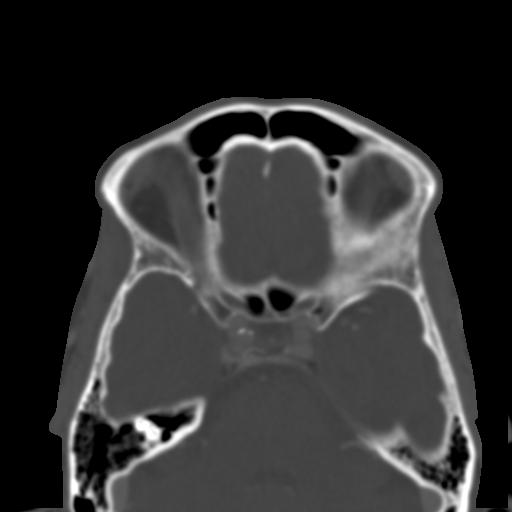

[Series 4: coronal soft · coronal · 0.32mm/px · 3 of 80 slices shown]
[im 27/80  bone]
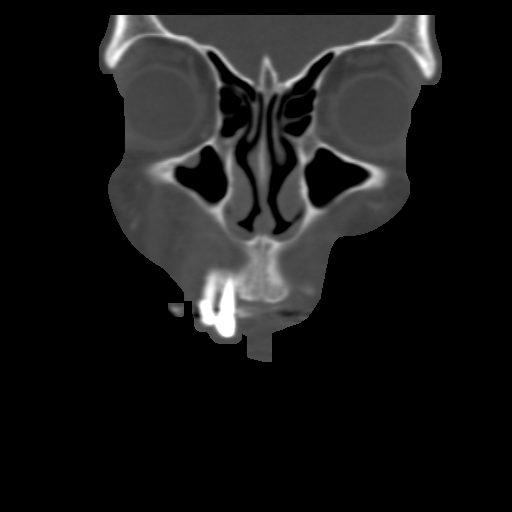
[im 36/80  bone]
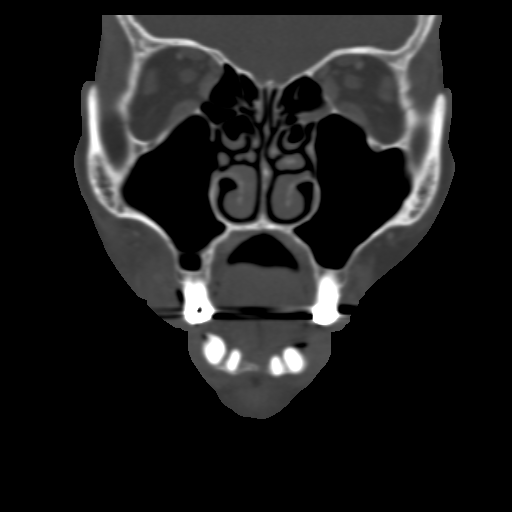
[im 44/80  bone]
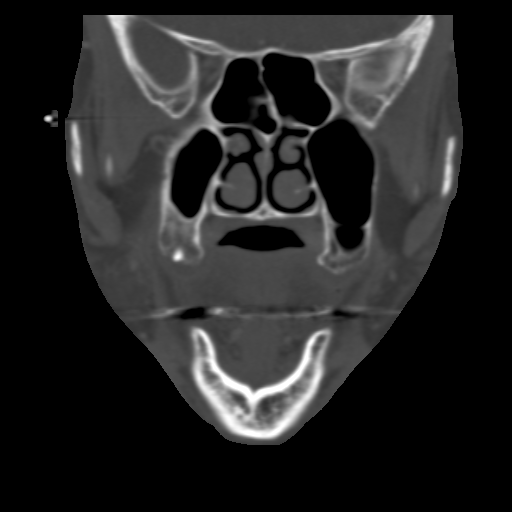

[Series 5: sagittal soft · sagittal · 0.32mm/px · 3 of 81 slices shown]
[im 27/81  bone]
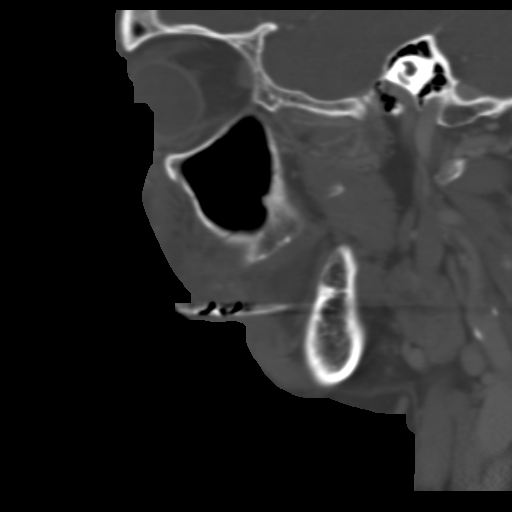
[im 41/81  bone]
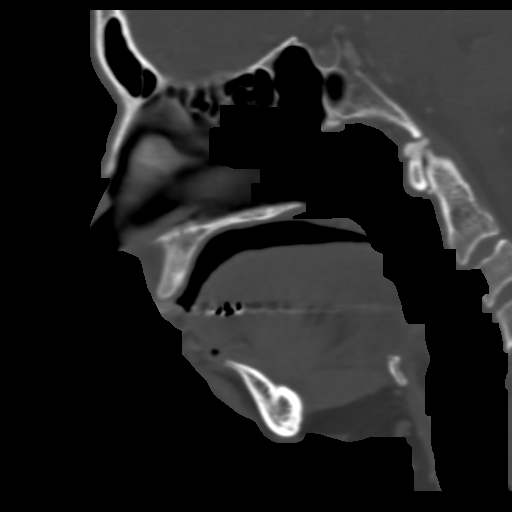
[im 54/81  bone]
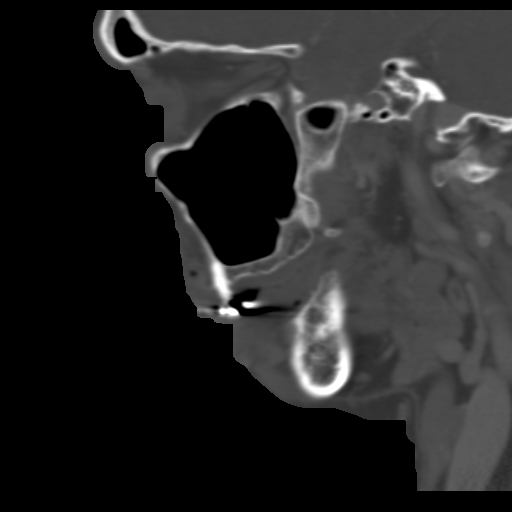

[16 of 47 positions shown; findings below may reference images not displayed]

FINDINGS: Moderate right maxillary soft tissue swelling is seen. There is a
rim enhancing low attenuation collection 5 x 16 mm adjacent to the
right maxilla. This is adjacent to the root of the right maxillary
canine which shows periapical lucency, consistent with odontogenic
infection.

No other soft tissue abscess or mass identified. No other facial
bone abnormality identified. Orbits are unremarkable in appearance.
No evidence of sinus air-fluid levels.
IMPRESSION: Right maxillary cellulitis with small abscess due to odontogenic
infection involving the right maxillary canine tooth.

## 2016-05-26 ENCOUNTER — Other Ambulatory Visit: Payer: Self-pay | Admitting: Nurse Practitioner
